# Patient Record
Sex: Male | Born: 1950 | Race: White | Hispanic: No | Marital: Married | Smoking: Never smoker
Health system: Southern US, Community
[De-identification: ages and names within clinical notes are randomized; demographics above are authoritative.]

## PROBLEM LIST (undated history)

## (undated) DIAGNOSIS — R42 Dizziness and giddiness: Secondary | ICD-10-CM

## (undated) DIAGNOSIS — G4730 Sleep apnea, unspecified: Secondary | ICD-10-CM

## (undated) DIAGNOSIS — E7800 Pure hypercholesterolemia, unspecified: Secondary | ICD-10-CM

## (undated) DIAGNOSIS — T4145XA Adverse effect of unspecified anesthetic, initial encounter: Secondary | ICD-10-CM

## (undated) DIAGNOSIS — R112 Nausea with vomiting, unspecified: Secondary | ICD-10-CM

## (undated) DIAGNOSIS — N400 Enlarged prostate without lower urinary tract symptoms: Secondary | ICD-10-CM

## (undated) DIAGNOSIS — Z9889 Other specified postprocedural states: Secondary | ICD-10-CM

## (undated) DIAGNOSIS — M1990 Unspecified osteoarthritis, unspecified site: Secondary | ICD-10-CM

## (undated) DIAGNOSIS — T8859XA Other complications of anesthesia, initial encounter: Secondary | ICD-10-CM

## (undated) HISTORY — PX: FRACTURE SURGERY: SHX138

## (undated) HISTORY — PX: KNEE SURGERY: SHX244

---

## 1978-04-18 HISTORY — PX: FRACTURE SURGERY: SHX138

## 2015-03-19 HISTORY — PX: HAND SURGERY: SHX662

## 2018-06-07 ENCOUNTER — Inpatient Hospital Stay: Admission: RE | Admit: 2018-06-07 | Payer: Self-pay | Source: Ambulatory Visit

## 2018-06-10 ENCOUNTER — Inpatient Hospital Stay: Admission: RE | Admit: 2018-06-10 | Payer: Self-pay | Source: Ambulatory Visit

## 2018-06-11 ENCOUNTER — Inpatient Hospital Stay: Admission: RE | Admit: 2018-06-11 | Payer: PRIVATE HEALTH INSURANCE | Source: Ambulatory Visit

## 2018-06-14 ENCOUNTER — Encounter
Admission: RE | Admit: 2018-06-14 | Discharge: 2018-06-14 | Disposition: A | Discharge status: Home / Self Care | Payer: Medicare Other | Source: Ambulatory Visit | Attending: Orthopedic Surgery | Admitting: Orthopedic Surgery

## 2018-06-14 ENCOUNTER — Encounter: Payer: Self-pay | Admitting: *Deleted

## 2018-06-14 ENCOUNTER — Other Ambulatory Visit: Payer: Self-pay

## 2018-06-14 HISTORY — DX: Other complications of anesthesia, initial encounter: T88.59XA

## 2018-06-14 HISTORY — DX: Nausea with vomiting, unspecified: Z98.890

## 2018-06-14 HISTORY — DX: Other specified postprocedural states: R11.2

## 2018-06-14 HISTORY — DX: Adverse effect of unspecified anesthetic, initial encounter: T41.45XA

## 2018-06-14 NOTE — Patient Instructions (Signed)
Your procedure is scheduled on:06/15/18 Report to Day Surgery. [facility] To find out your arrival time please call [phone] between 1PM - 3PM on 06/14/18  Remember: Instructions that are not followed completely may result in serious medical risk,  up to and including death, or upon the discretion of your surgeon and anesthesiologist your  surgery may need to be rescheduled.     _X__ 1. Do not eat food after midnight the night before your procedure.                 No gum chewing or hard candies. You may drink clear liquids up to 2 hours                 before you are scheduled to arrive for your surgery- DO not drink clear                 liquids within 2 hours of the start of your surgery.                 Clear Liquids include:  water, apple juice without pulp, clear carbohydrate                 drink such as Clearfast of Gatorade, Black Coffee or Tea (Do not add                 anything to coffee or tea).  __X__2.  On the morning of surgery brush your teeth with toothpaste and water, you                may rinse your mouth with mouthwash if you wish.  Do not swallow any toothpaste of mouthwash.     _X__ 3.  No Alcohol for 24 hours before or after surgery.   _X__ 4.  Do Not Smoke or use e-cigarettes For 24 Hours Prior to Your Surgery.                 Do not use any chewable tobacco products for at least 6 hours prior to                 surgery.  _X___  5.  Bring all medications with you on the day of surgery if instructed.   ____  6.  Notify your doctor if there is any change in your medical condition      (cold, fever, infections).     Do not wear jewelry, make-up, hairpins, clips or nail polish. Do not wear lotions, powders, or perfumes. You may wear deodorant. Do not shave 48 hours prior to surgery. Men may shave face and neck. Do not bring valuables to the hospital.    [facility] is not responsible for any belongings or  valuables.  Contacts, dentures or bridgework may not be worn into surgery. Leave your suitcase in the car. After surgery it may be brought to your room. For patients admitted to the hospital, discharge time is determined by your treatment team.   Patients discharged the day of surgery will not be allowed to drive home.      __X__ Take these medicines the morning of surgery with A SIP OF WATER:    1. FLOMAX  2.   3.   4.  5.  6.  ____ Fleet Enema (as directed)   ____ Use CHG Soap as directed  ____ Use inhalers on the day of surgery  ____ Stop metformin 2 days prior to surgery  ____ Take 1/2 of usual insulin dose the night before surgery. No insulin the morning          of surgery.   ____ Stop Coumadin/Plavix/aspirin on   ____ Stop Anti-inflammatories on     NO MORE MOBIC TODAY  ____ Stop supplements until after surgery.    ___X_ Bring C-Pap to the hospital.

## 2018-06-15 ENCOUNTER — Encounter
Admission: RE | Disposition: A | Discharge status: Home / Self Care | Payer: Self-pay | Source: Ambulatory Visit | Attending: Orthopedic Surgery

## 2018-06-15 ENCOUNTER — Ambulatory Visit: Payer: Medicare Other | Admitting: Anesthesiology

## 2018-06-15 ENCOUNTER — Ambulatory Visit
Admission: RE | Admit: 2018-06-15 | Discharge: 2018-06-15 | Disposition: A | Discharge status: Home / Self Care | Payer: Medicare Other | Source: Ambulatory Visit | Attending: Orthopedic Surgery | Admitting: Orthopedic Surgery

## 2018-06-15 ENCOUNTER — Other Ambulatory Visit: Payer: Self-pay

## 2018-06-15 ENCOUNTER — Encounter: Payer: Self-pay | Admitting: Emergency Medicine

## 2018-06-15 DIAGNOSIS — M65332 Trigger finger, left middle finger: Secondary | ICD-10-CM | POA: Diagnosis present

## 2018-06-15 DIAGNOSIS — N400 Enlarged prostate without lower urinary tract symptoms: Secondary | ICD-10-CM | POA: Insufficient documentation

## 2018-06-15 DIAGNOSIS — Z79899 Other long term (current) drug therapy: Secondary | ICD-10-CM | POA: Diagnosis not present

## 2018-06-15 DIAGNOSIS — E7800 Pure hypercholesterolemia, unspecified: Secondary | ICD-10-CM | POA: Diagnosis not present

## 2018-06-15 DIAGNOSIS — M25861 Other specified joint disorders, right knee: Secondary | ICD-10-CM | POA: Insufficient documentation

## 2018-06-15 DIAGNOSIS — E785 Hyperlipidemia, unspecified: Secondary | ICD-10-CM | POA: Diagnosis not present

## 2018-06-15 DIAGNOSIS — Z9989 Dependence on other enabling machines and devices: Secondary | ICD-10-CM | POA: Insufficient documentation

## 2018-06-15 DIAGNOSIS — G4733 Obstructive sleep apnea (adult) (pediatric): Secondary | ICD-10-CM | POA: Diagnosis not present

## 2018-06-15 DIAGNOSIS — G4730 Sleep apnea, unspecified: Secondary | ICD-10-CM | POA: Diagnosis not present

## 2018-06-15 HISTORY — DX: Sleep apnea, unspecified: G47.30

## 2018-06-15 HISTORY — DX: Unspecified osteoarthritis, unspecified site: M19.90

## 2018-06-15 HISTORY — DX: Pure hypercholesterolemia, unspecified: E78.00

## 2018-06-15 HISTORY — DX: Benign prostatic hyperplasia without lower urinary tract symptoms: N40.0

## 2018-06-15 HISTORY — PX: EAR CYST EXCISION: SHX22

## 2018-06-15 SURGERY — EXCISION, SYNOVIAL CYST, POPLITEAL SPACE
Anesthesia: General | Laterality: Right

## 2018-06-15 MED ORDER — PROPOFOL 10 MG/ML IV BOLUS
INTRAVENOUS | Status: DC | PRN
  Administered 2018-06-15: 50 mg via INTRAVENOUS
  Administered 2018-06-15: 150 mg via INTRAVENOUS

## 2018-06-15 MED ORDER — ONDANSETRON HCL 4 MG PO TABS: 4.0000 mg | ORAL_TABLET | Freq: Four times a day (QID) | ORAL | Status: DC | PRN

## 2018-06-15 MED ORDER — ONDANSETRON HCL 4 MG/2ML IJ SOLN
INTRAMUSCULAR | Status: DC | PRN
  Administered 2018-06-15: 4 mg via INTRAVENOUS

## 2018-06-15 MED ORDER — DEXAMETHASONE SODIUM PHOSPHATE 4 MG/ML IJ SOLN
INTRAMUSCULAR | Status: DC | PRN
  Administered 2018-06-15: 5 mg via INTRAVENOUS

## 2018-06-15 MED ORDER — MIDAZOLAM HCL 2 MG/2ML IJ SOLN
INTRAMUSCULAR | Status: AC
  Filled 2018-06-15: qty 2

## 2018-06-15 MED ORDER — LIDOCAINE HCL (CARDIAC) PF 100 MG/5ML IV SOSY
PREFILLED_SYRINGE | INTRAVENOUS | Status: DC | PRN
  Administered 2018-06-15: 100 mg via INTRAVENOUS

## 2018-06-15 MED ORDER — LIDOCAINE HCL (PF) 2 % IJ SOLN
INTRAMUSCULAR | Status: AC
  Filled 2018-06-15: qty 10

## 2018-06-15 MED ORDER — FAMOTIDINE 20 MG PO TABS
20.0000 mg | ORAL_TABLET | Freq: Once | ORAL | Status: AC
  Administered 2018-06-15: 20 mg via ORAL

## 2018-06-15 MED ORDER — SODIUM CHLORIDE 0.9 % IV SOLN: INTRAVENOUS | Status: DC

## 2018-06-15 MED ORDER — HYDROCODONE-ACETAMINOPHEN 5-325 MG PO TABS: 1.0000 | ORAL_TABLET | ORAL | Status: DC | PRN

## 2018-06-15 MED ORDER — KETAMINE HCL 50 MG/ML IJ SOLN
INTRAMUSCULAR | Status: DC | PRN
  Administered 2018-06-15: 25 mg via INTRAVENOUS

## 2018-06-15 MED ORDER — MIDAZOLAM HCL 2 MG/2ML IJ SOLN
INTRAMUSCULAR | Status: DC | PRN
  Administered 2018-06-15: 1 mg via INTRAVENOUS

## 2018-06-15 MED ORDER — DEXAMETHASONE SODIUM PHOSPHATE 10 MG/ML IJ SOLN
INTRAMUSCULAR | Status: AC
  Filled 2018-06-15: qty 1

## 2018-06-15 MED ORDER — ONDANSETRON HCL 4 MG/2ML IJ SOLN: 4.0000 mg | Freq: Four times a day (QID) | INTRAMUSCULAR | Status: DC | PRN

## 2018-06-15 MED ORDER — METOCLOPRAMIDE HCL 5 MG/ML IJ SOLN: 5.0000 mg | Freq: Three times a day (TID) | INTRAMUSCULAR | Status: DC | PRN

## 2018-06-15 MED ORDER — KETAMINE HCL 50 MG/ML IJ SOLN
INTRAMUSCULAR | Status: AC
  Filled 2018-06-15: qty 10

## 2018-06-15 MED ORDER — FENTANYL CITRATE (PF) 250 MCG/5ML IJ SOLN
INTRAMUSCULAR | Status: AC
  Filled 2018-06-15: qty 5

## 2018-06-15 MED ORDER — BUPIVACAINE HCL (PF) 0.5 % IJ SOLN
INTRAMUSCULAR | Status: DC | PRN
  Administered 2018-06-15: 15 mL

## 2018-06-15 MED ORDER — FENTANYL CITRATE (PF) 100 MCG/2ML IJ SOLN
INTRAMUSCULAR | Status: DC | PRN
  Administered 2018-06-15: 50 ug via INTRAVENOUS

## 2018-06-15 MED ORDER — HYDROCODONE-ACETAMINOPHEN 5-325 MG PO TABS: 1.0000 | ORAL_TABLET | Freq: Four times a day (QID) | ORAL | 0 refills | Status: DC | PRN

## 2018-06-15 MED ORDER — FENTANYL CITRATE (PF) 100 MCG/2ML IJ SOLN: 25.0000 ug | INTRAMUSCULAR | Status: DC | PRN

## 2018-06-15 MED ORDER — LACTATED RINGERS IV SOLN
INTRAVENOUS | Status: DC
  Administered 2018-06-15: 11:00:00 via INTRAVENOUS

## 2018-06-15 MED ORDER — PROPOFOL 500 MG/50ML IV EMUL
INTRAVENOUS | Status: AC
  Filled 2018-06-15: qty 50

## 2018-06-15 MED ORDER — GLYCOPYRROLATE 0.2 MG/ML IJ SOLN
INTRAMUSCULAR | Status: AC
  Filled 2018-06-15: qty 1

## 2018-06-15 MED ORDER — FAMOTIDINE 20 MG PO TABS
ORAL_TABLET | ORAL | Status: AC
  Filled 2018-06-15: qty 1

## 2018-06-15 MED ORDER — ONDANSETRON HCL 4 MG/2ML IJ SOLN: 4.0000 mg | Freq: Once | INTRAMUSCULAR | Status: DC | PRN

## 2018-06-15 MED ORDER — SODIUM CHLORIDE 0.9 % IV SOLN
INTRAVENOUS | Status: DC | PRN
  Administered 2018-06-15: 15 ug/kg/min via INTRAVENOUS
  Administered 2018-06-15: 10 ug/kg/min via INTRAVENOUS

## 2018-06-15 MED ORDER — NEOMYCIN-POLYMYXIN B GU 40-200000 IR SOLN
Status: DC | PRN
  Administered 2018-06-15: 2 mL

## 2018-06-15 MED ORDER — ONDANSETRON HCL 4 MG/2ML IJ SOLN
INTRAMUSCULAR | Status: AC
  Filled 2018-06-15: qty 2

## 2018-06-15 MED ORDER — METOCLOPRAMIDE HCL 10 MG PO TABS: 5.0000 mg | ORAL_TABLET | Freq: Three times a day (TID) | ORAL | Status: DC | PRN

## 2018-06-15 MED ORDER — PROPOFOL 500 MG/50ML IV EMUL
INTRAVENOUS | Status: DC | PRN
  Administered 2018-06-15: 100 ug/kg/min via INTRAVENOUS
  Administered 2018-06-15: 150 ug/kg/min via INTRAVENOUS

## 2018-06-15 SURGICAL SUPPLY — 39 items
BANDAGE ELASTIC 6 LF NS (GAUZE/BANDAGES/DRESSINGS) ×3 IMPLANT
BNDG COHESIVE 6X5 TAN STRL LF (GAUZE/BANDAGES/DRESSINGS) ×2 IMPLANT
CANISTER SUCT 1200ML W/VALVE (MISCELLANEOUS) ×3 IMPLANT
CAST PADDING 2X4YD ST 30245 (MISCELLANEOUS) ×2
CHLORAPREP W/TINT 26ML (MISCELLANEOUS) ×5 IMPLANT
COVER WAND RF STERILE (DRAPES) ×3 IMPLANT
CUFF TOURN 18 STER (MISCELLANEOUS) ×2 IMPLANT
CUFF TOURN 24 STER (MISCELLANEOUS) IMPLANT
CUFF TOURN 30 STER DUAL PORT (MISCELLANEOUS) ×2 IMPLANT
DRAPE SHEET LG 3/4 BI-LAMINATE (DRAPES) ×2 IMPLANT
ELECT CAUTERY BLADE 6.4 (BLADE) ×3 IMPLANT
ELECT REM PT RETURN 9FT ADLT (ELECTROSURGICAL) ×3
ELECTRODE REM PT RTRN 9FT ADLT (ELECTROSURGICAL) ×1 IMPLANT
GAUZE PETRO XEROFOAM 1X8 (MISCELLANEOUS) ×3 IMPLANT
GAUZE SPONGE 4X4 12PLY STRL (GAUZE/BANDAGES/DRESSINGS) ×3 IMPLANT
GLOVE SURG SYN 9.0  PF PI (GLOVE) ×2
GLOVE SURG SYN 9.0 PF PI (GLOVE) ×1 IMPLANT
GOWN SRG 2XL LVL 4 RGLN SLV (GOWNS) ×1 IMPLANT
GOWN STRL NON-REIN 2XL LVL4 (GOWNS) ×2
GOWN STRL REUS W/ TWL LRG LVL3 (GOWN DISPOSABLE) ×1 IMPLANT
GOWN STRL REUS W/TWL LRG LVL3 (GOWN DISPOSABLE) ×2
KIT TURNOVER KIT A (KITS) ×3 IMPLANT
NS IRRIG 500ML POUR BTL (IV SOLUTION) ×3 IMPLANT
PACK EXTREMITY ARMC (MISCELLANEOUS) ×3 IMPLANT
PAD ABD DERMACEA PRESS 5X9 (GAUZE/BANDAGES/DRESSINGS) ×3 IMPLANT
PAD CAST CTTN 4X4 STRL (SOFTGOODS) IMPLANT
PADDING CAST COTTON 2X4 ST (MISCELLANEOUS) IMPLANT
PADDING CAST COTTON 4X4 STRL (SOFTGOODS) ×2
SCALPEL PROTECTED #10 DISP (BLADE) ×3 IMPLANT
STAPLER SKIN PROX 35W (STAPLE) ×3 IMPLANT
STOCKINETTE IMPERV 14X48 (MISCELLANEOUS) ×2 IMPLANT
SUT ETHILON 3-0 FS-10 30 BLK (SUTURE) ×3
SUT VIC AB 0 CT1 27 (SUTURE) ×2
SUT VIC AB 0 CT1 27XCR 8 STRN (SUTURE) ×1 IMPLANT
SUT VIC AB 0 CT1 36 (SUTURE) ×3 IMPLANT
SUT VIC AB 2-0 CT1 27 (SUTURE) ×2
SUT VIC AB 2-0 CT1 TAPERPNT 27 (SUTURE) ×1 IMPLANT
SUTURE EHLN 3-0 FS-10 30 BLK (SUTURE) IMPLANT
SYR 10ML LL (SYRINGE) ×3 IMPLANT

## 2018-06-15 NOTE — Anesthesia Post-op Follow-up Note (Signed)
Anesthesia QCDR form completed.        

## 2018-06-15 NOTE — Op Note (Signed)
06/15/2018  1:12 PM  PATIENT:  Mark Davenport  67 y.o. male  PRE-OPERATIVE DIAGNOSIS:  CYST OF JOINT RIGHT KNEE, TRIGGER FINGER LEFT  POST-OPERATIVE DIAGNOSIS:  CYST OF JOINT RIGHT KNEE, TRIGGER FINGER LEFT  PROCEDURE:  Procedure(s): EXCISION  KNEE CYST (Right) left trigger finger release long finger  SURGEON: Leitha Schuller, MD  ASSISTANTS: None  ANESTHESIA:   general  EBL:  Total I/O In: 700 [I.V.:700] Out: 1 [Blood:1]  BLOOD ADMINISTERED:none  DRAINS: none   LOCAL MEDICATIONS USED:  MARCAINE     SPECIMEN:  No Specimen  DISPOSITION OF SPECIMEN:  N/A  COUNTS:  YES  TOURNIQUET:  * Missing tourniquet times found for documented tourniquets in log: [id number] *  IMPLANTS: None  DICTATION: .Dragon Dictation patient was brought to the operating room and after adequate general anesthesia was obtained the left arm was prepped and draped you sterile fashion.  After patient identification and timeout procedures were completed tourniquet was raised.  Transverse incision approximately centimeter half was made at the distal flexion crease of the palm in line with the finger the subcutaneous tissue was spread and retractors placed to protect the neurovascular bundles there is a very thickened A1 pulley which was visible on immediately and on release there is a large amount of flexor tenosynovitis and fluid present deep and proximal to this after release of the entire A1 pulley there is no triggering the tendon itself was intact.  The wound was then closed with simple interrupted 4-0 nylon suture followed by infiltration of 5 cc half percent Sensorcaine for postop analgesia with a finger then wrapped with Xeroform 4 x 4's web roll and Ace wrap tourniquet let down with 5-minute tourniquet on the hand Next the right leg was prepped and draped in the sterile fashion and a longitudinal incision made at the distal patellar tendon and its near its insertion incision through the skin revealed a ganglion  cyst which was then excised there did appear to be a small defect in the patellar tendon this was incised and debrided and then repaired with 0 Vicryl and the subcutaneous layer was closed with 2-0 Vicryl and skin closed with 4-0 nylon in a simple interrupted manner with assist being completely excised.  10 cc of half percent Sensorcaine plain was infiltrated in the area around the incision for postop analgesia Xeroform 4 x 4's web roll and Ace wrap applied x10 minutes on the knee  PLAN OF CARE: Discharge to home after PACU  PATIENT DISPOSITION:  PACU - hemodynamically stable.

## 2018-06-15 NOTE — Transfer of Care (Signed)
Immediate Anesthesia Transfer of Care Note  Patient: Mark Davenport  Procedure(s) Performed: EXCISION  KNEE CYST (Right )  Patient Location: PACU  Anesthesia Type:General  Level of Consciousness: awake and patient cooperative  Airway & Oxygen Therapy: Patient Spontanous Breathing and Patient connected to nasal cannula oxygen  Post-op Assessment: Report given to RN and Post -op Vital signs reviewed and stable  Post vital signs: Reviewed and stable  Last Vitals:  Vitals Value Taken Time  BP    Temp    Pulse 83 06/15/2018  1:21 PM  Resp    SpO2 100 % 06/15/2018  1:21 PM  Vitals shown include unvalidated device data.  Last Pain:  Vitals:   06/15/18 1100  TempSrc: Oral  PainSc: 0-No pain         Complications: No apparent anesthesia complications

## 2018-06-15 NOTE — Discharge Instructions (Addendum)
Work on finger range of motion with left hand and loosen Ace wrap prior to discharge and if fingers well.  Leave knee wrap on until return visit with weightbearing as tolerated on right leg.  Medicine as directed  AMBULATORY SURGERY  DISCHARGE INSTRUCTIONS   1) The drugs that you were given will stay in your system until tomorrow so for the next 24 hours you should not:  A) Drive an automobile B) Make any legal decisions C) Drink any alcoholic beverage   2) You may resume regular meals tomorrow.  Today it is better to start with liquids and gradually work up to solid foods.  You may eat anything you prefer, but it is better to start with liquids, then soup and crackers, and gradually work up to solid foods.   3) Please notify your doctor immediately if you have any unusual bleeding, trouble breathing, redness and pain at the surgery site, drainage, fever, or pain not relieved by medication.    4) Additional Instructions:        Please contact your physician with any problems or Same Day Surgery at [phone], Monday through Friday 6 am to 4 pm, or [place] at [facility] number at [phone].

## 2018-06-15 NOTE — Anesthesia Procedure Notes (Signed)
Procedure Name: LMA Insertion Date/Time: 06/15/2018 12:35 PM Performed by: Darrol Jump, CRNA Pre-anesthesia Checklist: Patient identified, Emergency Drugs available, Suction available and Patient being monitored Patient Re-evaluated:Patient Re-evaluated prior to induction Oxygen Delivery Method: Circle system utilized Preoxygenation: Pre-oxygenation with 100% oxygen Induction Type: IV induction LMA Size: 5.0 Number of attempts: 1 Placement Confirmation: positive ETCO2 and breath sounds checked- equal and bilateral Tube secured with: Tape Dental Injury: Teeth and Oropharynx as per pre-operative assessment

## 2018-06-15 NOTE — Anesthesia Postprocedure Evaluation (Signed)
Anesthesia Post Note  Patient: Mark Davenport  Procedure(s) Performed: EXCISION  KNEE CYST (Right )  Patient location during evaluation: PACU Anesthesia Type: General Level of consciousness: awake and alert Pain management: pain level controlled Vital Signs Assessment: post-procedure vital signs reviewed and stable Respiratory status: spontaneous breathing, nonlabored ventilation, respiratory function stable and patient connected to nasal cannula oxygen Cardiovascular status: blood pressure returned to baseline and stable Postop Assessment: no apparent nausea or vomiting Anesthetic complications: no     Last Vitals:  Vitals:   06/15/18 1415 06/15/18 1430  BP: (!) 140/99 (!) 146/95  Pulse: (!) 57   Resp:    Temp: (!) 36.1 C   SpO2:  100%    Last Pain:  Vitals:   06/15/18 1430  TempSrc:   PainSc: 0-No pain                 Lenard Simmer

## 2018-06-15 NOTE — H&P (Signed)
Reviewed paper H+P, will be scanned into chart. No changes noted.  

## 2018-06-15 NOTE — OR Nursing (Signed)
Discharge instructions discussed with pt and wife. Both voice understanding. 

## 2018-06-15 NOTE — Anesthesia Preprocedure Evaluation (Signed)
Anesthesia Evaluation  Patient identified by MRN, date of birth, ID band Patient awake    Reviewed: Allergy & Precautions, H&P , NPO status , Patient's Chart, lab work & pertinent test results, reviewed documented beta blocker date and time   History of Anesthesia Complications (+) PONV and history of anesthetic complications  Airway Mallampati: III  TM Distance: >3 FB Neck ROM: full    Dental  (+) Dental Advidsory Given, Caps, Missing, Teeth Intact   Pulmonary neg shortness of breath, sleep apnea and Continuous Positive Airway Pressure Ventilation , neg COPD, neg recent URI,           Cardiovascular Exercise Tolerance: Good negative cardio ROS       Neuro/Psych negative neurological ROS  negative psych ROS   GI/Hepatic negative GI ROS, Neg liver ROS,   Endo/Other  negative endocrine ROS  Renal/GU negative Renal ROS  negative genitourinary   Musculoskeletal   Abdominal   Peds  Hematology negative hematology ROS (+)   Anesthesia Other Findings Past Medical History: No date: Arthritis No date: BPH (benign prostatic hyperplasia) No date: Complication of anesthesia No date: Hypercholesteremia No date: PONV (postoperative nausea and vomiting) No date: Sleep apnea     Comment:  OSA/CPAP   Reproductive/Obstetrics negative OB ROS                             Anesthesia Physical Anesthesia Plan  ASA: II  Anesthesia Plan: General   Post-op Pain Management:    Induction: Intravenous  PONV Risk Score and Plan: 3 and Propofol infusion, TIVA, Ondansetron, Dexamethasone and Treatment may vary due to age or medical condition  Airway Management Planned: LMA  Additional Equipment:   Intra-op Plan:   Post-operative Plan: Extubation in OR  Informed Consent: I have reviewed the patients History and Physical, chart, labs and discussed the procedure including the risks, benefits and  alternatives for the proposed anesthesia with the patient or authorized representative who has indicated his/her understanding and acceptance.   Dental Advisory Given  Plan Discussed with: Anesthesiologist, CRNA and Surgeon  Anesthesia Plan Comments:         Anesthesia Quick Evaluation

## 2018-06-16 ENCOUNTER — Encounter: Payer: Self-pay | Admitting: Orthopedic Surgery

## 2019-08-22 ENCOUNTER — Ambulatory Visit: Payer: PRIVATE HEALTH INSURANCE | Attending: Internal Medicine

## 2019-08-22 DIAGNOSIS — Z20822 Contact with and (suspected) exposure to covid-19: Secondary | ICD-10-CM

## 2019-08-22 DIAGNOSIS — U071 COVID-19: Secondary | ICD-10-CM

## 2019-08-22 HISTORY — DX: COVID-19: U07.1

## 2019-08-24 LAB — NOVEL CORONAVIRUS, NAA: SARS-CoV-2, NAA: DETECTED — AB

## 2019-08-25 ENCOUNTER — Telehealth: Payer: Self-pay | Admitting: Nurse Practitioner

## 2019-08-25 NOTE — Telephone Encounter (Signed)
Called to Discuss with patient about Covid symptoms and the use of bamlanivimab, a monoclonal antibody infusion for those with mild to moderate Covid symptoms and at a high risk of hospitalization.     Pt is qualified for this infusion at the [facility] due to co-morbid conditions and/or a member of an at-risk group.     Unable to reach pt  

## 2020-10-18 ENCOUNTER — Other Ambulatory Visit: Payer: Self-pay | Admitting: Orthopedic Surgery

## 2020-10-18 DIAGNOSIS — M25511 Pain in right shoulder: Secondary | ICD-10-CM

## 2020-10-23 ENCOUNTER — Ambulatory Visit
Admission: RE | Admit: 2020-10-23 | Discharge: 2020-10-23 | Disposition: A | Discharge status: Home / Self Care | Payer: Medicare HMO | Source: Ambulatory Visit | Attending: Orthopedic Surgery | Admitting: Orthopedic Surgery

## 2020-10-23 ENCOUNTER — Other Ambulatory Visit: Payer: Self-pay

## 2020-10-23 DIAGNOSIS — M25511 Pain in right shoulder: Secondary | ICD-10-CM | POA: Insufficient documentation

## 2020-10-23 IMAGING — MR MR SHOULDER*R* W/O CM
4 of 5 series · 32 of 40 positions shown · non-contrast
Comparison: None.

CLINICAL DATA: anterior right shoulder pain

EXAM:
MRI OF THE RIGHT SHOULDER WITHOUT CONTRAST
TECHNIQUE: Multiplanar, multisequence MR imaging of the shoulder was performed.
No intravenous contrast was administered.

[Series 5: T2 fat-sat · axial · right · 4.0mm · 0.55mm/px · z∈[-43,+77]mm · 8 of 26 slices shown (1 of 3)]
[im 1/26]
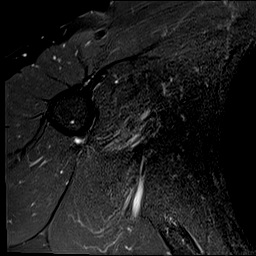
[im 4/26]
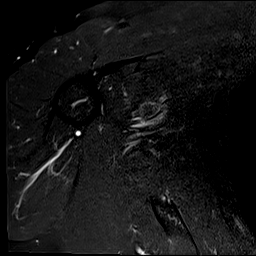
[im 8/26]
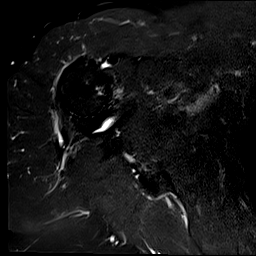
[im 11/26]
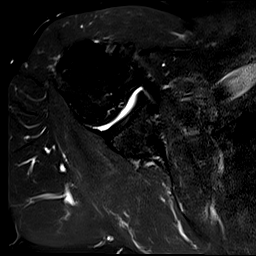
[im 15/26]
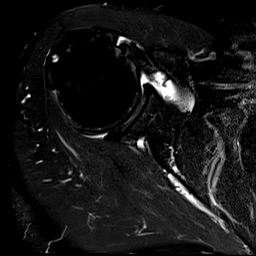
[im 18/26]
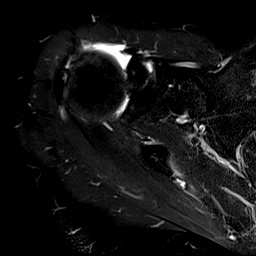
[im 22/26]
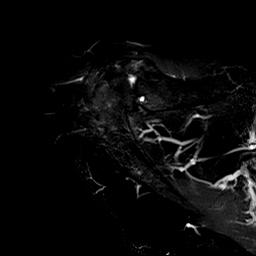
[im 26/26]
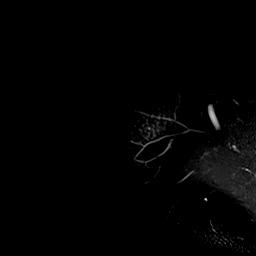

[Series 6: PD · oblique · right · 4.0mm · 0.44mm/px · 9 of 26 slices shown]
[im 1/26]
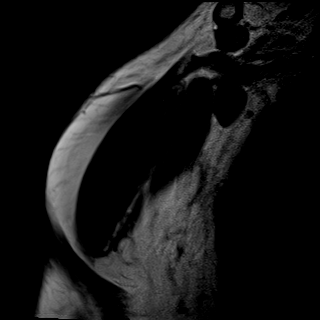
[im 4/26]
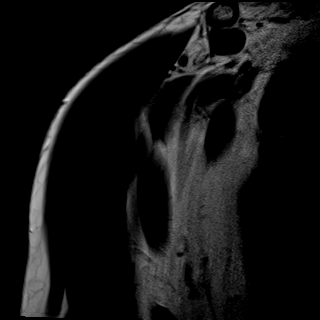
[im 7/26]
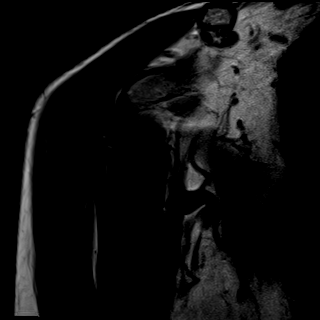
[im 10/26]
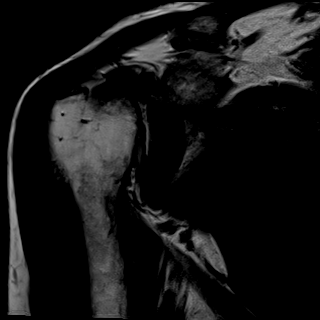
[im 13/26]
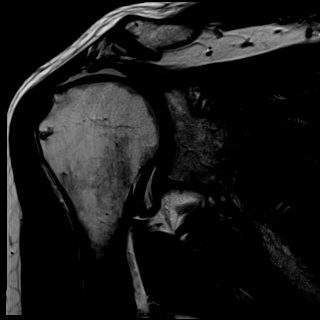
[im 16/26]
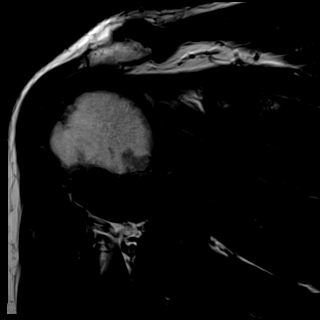
[im 19/26]
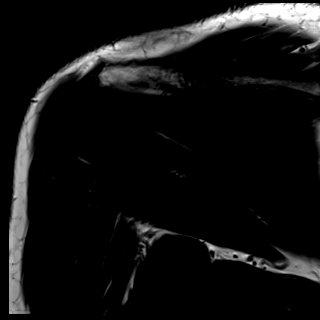
[im 22/26]
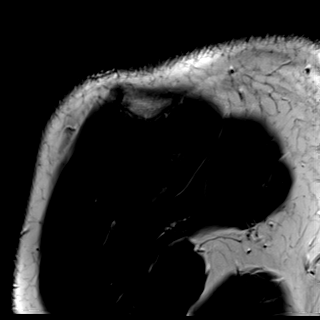
[im 26/26]
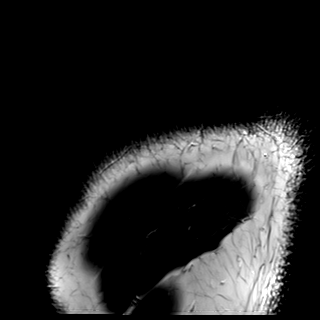

[Series 7: T2 fat-sat · oblique · right · 4.0mm · 0.44mm/px · 9 of 26 slices shown (2 of 3)]
[im 1/26]
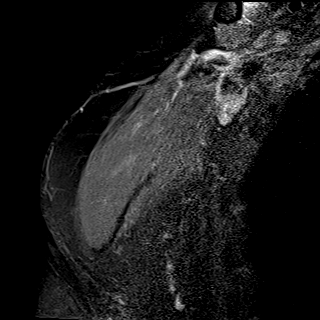
[im 4/26]
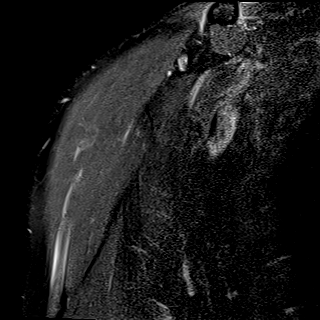
[im 7/26]
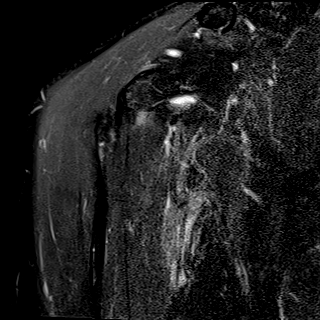
[im 10/26]
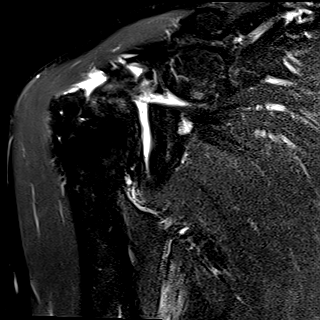
[im 13/26]
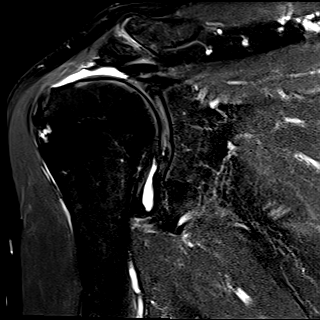
[im 16/26]
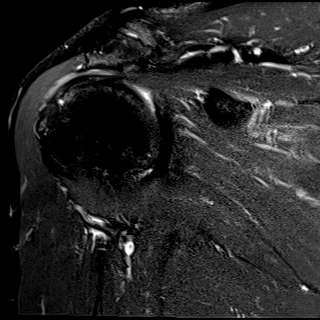
[im 19/26]
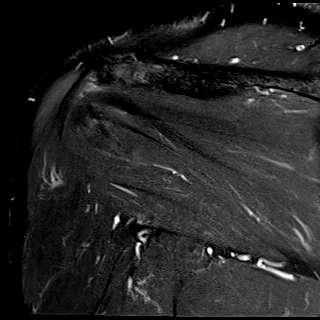
[im 22/26]
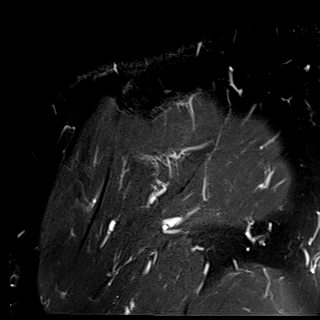
[im 26/26]
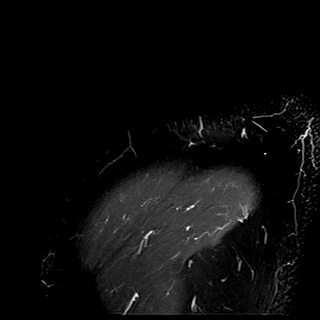

[Series 8: T2 fat-sat · oblique · right · 4.0mm · 0.23mm/px · 6 of 22 slices shown (3 of 3)]
[im 1/22]
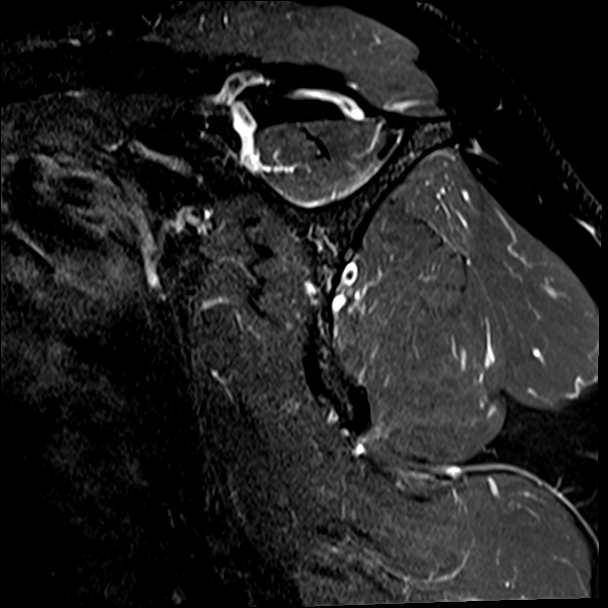
[im 4/22]
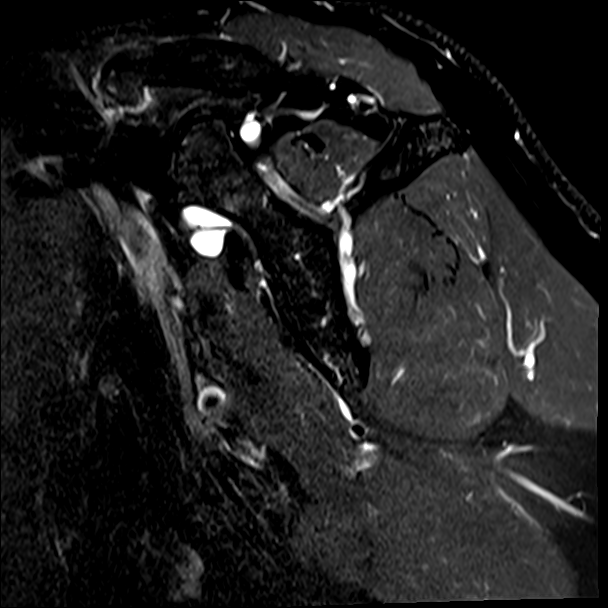
[im 8/22]
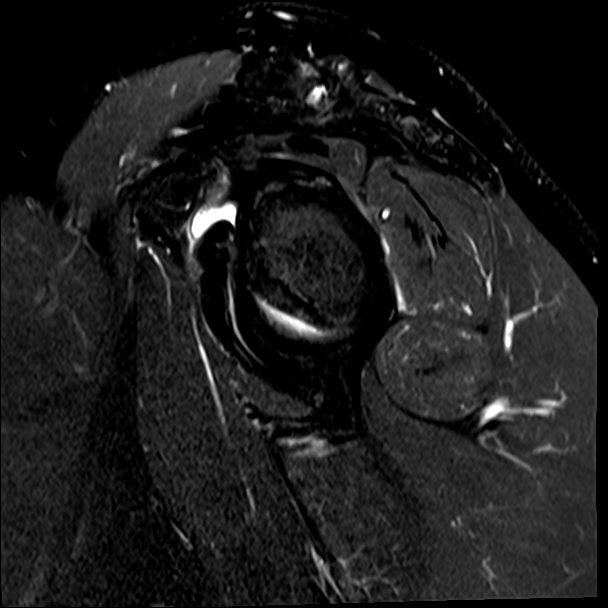
[im 11/22]
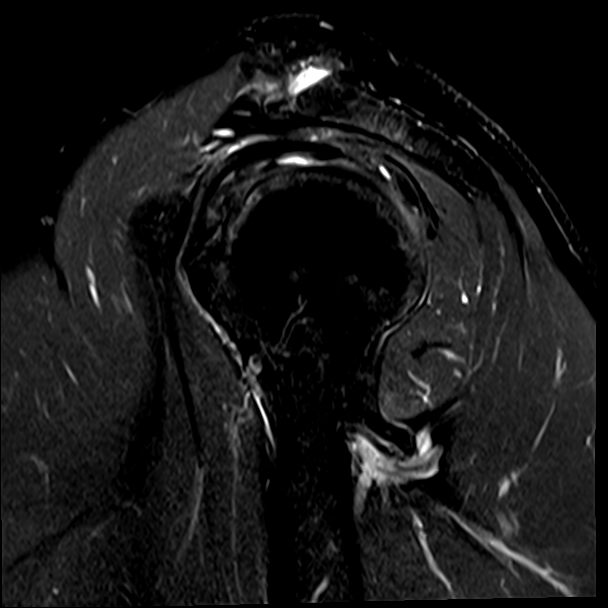
[im 15/22]
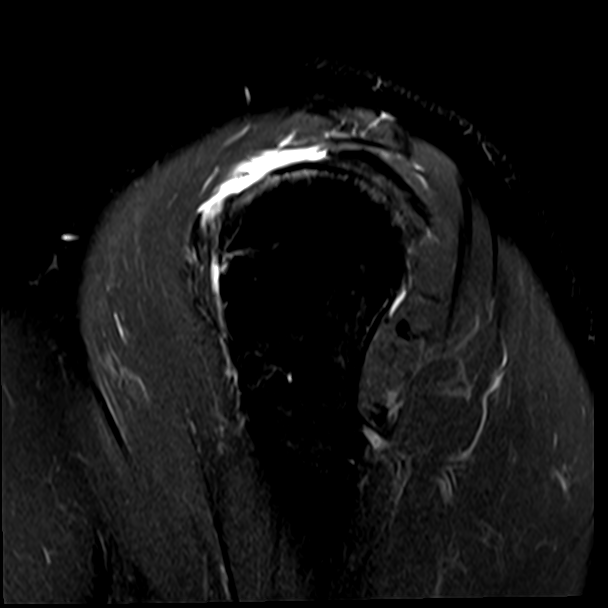
[im 18/22]
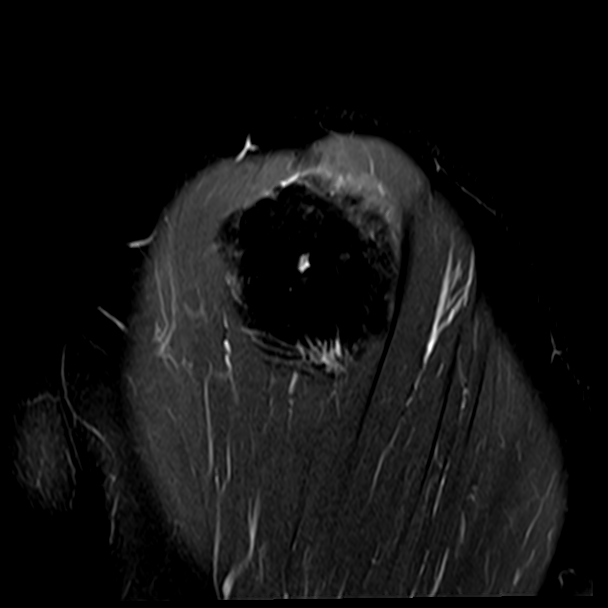

[32 of 40 positions shown; findings below may reference images not displayed]

FINDINGS: Rotator cuff: There is a complete full-thickness tear of the
supraspinatus tendon measuring 3 cm and transverse dimension and
extending approximately 1.3 cm from the tendon insertion site. There
is resultant slightly high-riding humeral head and fluid in the
subacromial-subdeltoid bursa. Increased globular signal thickening
is seen throughout the subscapularis tendon. There is a tiny focal
full-thickness tear of the superior subscapularis tendon with
approximately 1.3 cm of tendon retraction. Increased globular signal
seen within the infraspinatus tendon. The teres minor tendon is
intact. The muscles of the rotator cuff are normal without tear,
edema, or atrophy.

Muscles: The muscles other than the rotator cuff are normal without
tear, edema, or atrophy.

Biceps Long Head: Increased globular signal seen within the
intra-articular portion of the long head of the biceps tendon,
however it is intact.

Acromioclavicular Joint: Moderate AC joint arthrosis seen with joint
space loss capsular hypertrophy and joint fluid. Type II acromion.

Glenohumeral Joint: A slightly high-riding humeral head is seen.
Mild glenohumeral joint chondral irregularity is noted within the
inferior joint space. A small glenohumeral joint effusion is seen.

Labrum: Attenuation of the superior labrum is seen. However the
evaluation is limited by lack of intraarticular fluid.

Bones: No fracture, osteonecrosis, or pathologic marrow
infiltration. Cystic changes are seen at the lateral corner of the
humeral head.

Other: Fluid seen within the subacromial-subdeltoid bursa.
IMPRESSION: 1. Complete full-thickness tear of the supraspinatus tendon with
tendon retraction measuring approximately 3 cm and extending
approximately 1.3 cm from the tendon insertion site. There is
resultant high-riding humeral head and fluid in the
subacromial-subdeltoid bursa.
2. A small focal full-thickness tear of the superior subscapularis
tendon
3. Infraspinatus tendinosis
4. Mild glenohumeral joint osteoarthritis with superior labral
degeneration
5. Small glenohumeral joint effusion
6. Moderate AC joint arthrosis

## 2020-10-29 ENCOUNTER — Other Ambulatory Visit: Payer: Self-pay | Admitting: Orthopedic Surgery

## 2020-11-07 ENCOUNTER — Encounter: Payer: Self-pay | Admitting: Orthopedic Surgery

## 2020-11-07 ENCOUNTER — Other Ambulatory Visit: Payer: Self-pay

## 2020-11-14 ENCOUNTER — Other Ambulatory Visit: Payer: Self-pay

## 2020-11-14 ENCOUNTER — Other Ambulatory Visit
Admission: RE | Admit: 2020-11-14 | Discharge: 2020-11-14 | Disposition: A | Discharge status: Home / Self Care | Payer: Medicare HMO | Source: Ambulatory Visit | Attending: Orthopedic Surgery | Admitting: Orthopedic Surgery

## 2020-11-14 DIAGNOSIS — Z01812 Encounter for preprocedural laboratory examination: Secondary | ICD-10-CM | POA: Insufficient documentation

## 2020-11-14 DIAGNOSIS — Z20822 Contact with and (suspected) exposure to covid-19: Secondary | ICD-10-CM | POA: Insufficient documentation

## 2020-11-14 LAB — SARS CORONAVIRUS 2 (TAT 6-24 HRS): SARS Coronavirus 2: NEGATIVE

## 2020-11-16 ENCOUNTER — Ambulatory Visit: Payer: Medicare HMO | Admitting: Anesthesiology

## 2020-11-16 ENCOUNTER — Encounter
Admission: RE | Disposition: A | Discharge status: Home / Self Care | Payer: Self-pay | Source: Home / Self Care | Attending: Orthopedic Surgery

## 2020-11-16 ENCOUNTER — Other Ambulatory Visit: Payer: Self-pay

## 2020-11-16 ENCOUNTER — Ambulatory Visit
Admission: RE | Admit: 2020-11-16 | Discharge: 2020-11-16 | Disposition: A | Discharge status: Home / Self Care | Payer: Medicare HMO | Attending: Orthopedic Surgery | Admitting: Orthopedic Surgery

## 2020-11-16 ENCOUNTER — Encounter: Payer: Self-pay | Admitting: Orthopedic Surgery

## 2020-11-16 DIAGNOSIS — M25811 Other specified joint disorders, right shoulder: Secondary | ICD-10-CM | POA: Insufficient documentation

## 2020-11-16 DIAGNOSIS — M75121 Complete rotator cuff tear or rupture of right shoulder, not specified as traumatic: Secondary | ICD-10-CM | POA: Diagnosis not present

## 2020-11-16 DIAGNOSIS — M75101 Unspecified rotator cuff tear or rupture of right shoulder, not specified as traumatic: Secondary | ICD-10-CM | POA: Diagnosis present

## 2020-11-16 HISTORY — DX: Dizziness and giddiness: R42

## 2020-11-16 HISTORY — PX: SHOULDER ARTHROSCOPY WITH SUBACROMIAL DECOMPRESSION AND OPEN ROTATOR C: SHX5688

## 2020-11-16 SURGERY — SHOULDER ARTHROSCOPY WITH SUBACROMIAL DECOMPRESSION AND OPEN ROTATOR CUFF REPAIR, OPEN BICEPS TENDON REPAIR
Anesthesia: General | Site: Shoulder | Laterality: Right

## 2020-11-16 MED ORDER — OXYCODONE HCL 5 MG PO TABS
5.0000 mg | ORAL_TABLET | Freq: Once | ORAL | Status: DC | PRN
Start: 2020-11-16 — End: 2020-11-16

## 2020-11-16 MED ORDER — CEFAZOLIN SODIUM-DEXTROSE 2-4 GM/100ML-% IV SOLN
2.0000 g | INTRAVENOUS | Status: AC
  Administered 2020-11-16: 2 g via INTRAVENOUS

## 2020-11-16 MED ORDER — BUPIVACAINE HCL (PF) 0.5 % IJ SOLN
INTRAMUSCULAR | Status: DC | PRN
  Administered 2020-11-16: 100 mg

## 2020-11-16 MED ORDER — FENTANYL CITRATE (PF) 100 MCG/2ML IJ SOLN: 25.0000 ug | INTRAMUSCULAR | Status: DC | PRN

## 2020-11-16 MED ORDER — OXYCODONE HCL 5 MG PO TABS: 5.0000 mg | ORAL_TABLET | ORAL | 0 refills | Status: DC | PRN

## 2020-11-16 MED ORDER — PHENYLEPHRINE HCL (PRESSORS) 10 MG/ML IV SOLN
INTRAVENOUS | Status: DC | PRN
  Administered 2020-11-16: 100 ug via INTRAVENOUS

## 2020-11-16 MED ORDER — LIDOCAINE HCL (CARDIAC) PF 100 MG/5ML IV SOSY: PREFILLED_SYRINGE | INTRAVENOUS | Status: DC | PRN

## 2020-11-16 MED ORDER — MIDAZOLAM HCL 2 MG/2ML IJ SOLN
INTRAMUSCULAR | Status: DC | PRN
  Administered 2020-11-16: 2 mg via INTRAVENOUS

## 2020-11-16 MED ORDER — ACETAMINOPHEN 500 MG PO TABS: 1000.0000 mg | ORAL_TABLET | Freq: Three times a day (TID) | ORAL | 2 refills | Status: DC

## 2020-11-16 MED ORDER — FENTANYL CITRATE (PF) 100 MCG/2ML IJ SOLN
INTRAMUSCULAR | Status: DC | PRN
  Administered 2020-11-16: 100 ug via INTRAVENOUS

## 2020-11-16 MED ORDER — LACTATED RINGERS IV SOLN
INTRAVENOUS | Status: DC | PRN
  Administered 2020-11-16: 4 mL

## 2020-11-16 MED ORDER — MEPERIDINE HCL 25 MG/ML IJ SOLN: 6.2500 mg | INTRAMUSCULAR | Status: DC | PRN

## 2020-11-16 MED ORDER — PROPOFOL 500 MG/50ML IV EMUL
INTRAVENOUS | Status: DC | PRN
  Administered 2020-11-16: 160 ug/kg/min via INTRAVENOUS

## 2020-11-16 MED ORDER — LACTATED RINGERS IV SOLN: INTRAVENOUS | Status: DC

## 2020-11-16 MED ORDER — ONDANSETRON 4 MG PO TBDP: 4.0000 mg | ORAL_TABLET | Freq: Three times a day (TID) | ORAL | 0 refills | Status: DC | PRN

## 2020-11-16 MED ORDER — OXYCODONE HCL 5 MG/5ML PO SOLN: 5.0000 mg | Freq: Once | ORAL | Status: DC | PRN

## 2020-11-16 MED ORDER — BUPIVACAINE LIPOSOME 1.3 % IJ SUSP
INTRAMUSCULAR | Status: DC | PRN
  Administered 2020-11-16: 266 mg

## 2020-11-16 MED ORDER — PROMETHAZINE HCL 25 MG/ML IJ SOLN: 6.2500 mg | INTRAMUSCULAR | Status: DC | PRN

## 2020-11-16 MED ORDER — ONDANSETRON HCL 4 MG/2ML IJ SOLN
INTRAMUSCULAR | Status: DC | PRN
  Administered 2020-11-16: 4 mg via INTRAVENOUS

## 2020-11-16 MED ORDER — ASPIRIN EC 325 MG PO TBEC: 325.0000 mg | DELAYED_RELEASE_TABLET | Freq: Every day | ORAL | 0 refills | Status: AC

## 2020-11-16 SURGICAL SUPPLY — 67 items
ADAPTER IRRIG TUBE 2 SPIKE SOL (ADAPTER) ×4 IMPLANT
ADH SKN CLS APL DERMABOND .7 (GAUZE/BANDAGES/DRESSINGS) ×1
ADPR TBG 2 SPK PMP STRL ASCP (ADAPTER) ×2
ANCH SUT 2X2.3 2 STRN TPE (Anchor) ×1 IMPLANT
ANCH SUT SWLK 19.1X4.75 (Anchor) ×1 IMPLANT
ANCHOR 3.9 PEEK 3 CORKSCREW (Anchor) ×1 IMPLANT
ANCHOR ICONIX SPEED 2.0 TAPE (Anchor) ×1 IMPLANT
ANCHOR ICONIX SPEED 2.3 (Anchor) ×1 IMPLANT
ANCHOR SUT BIO SW 4.75X19.1 (Anchor) ×1 IMPLANT
ANCHOR TENDON ABSORBABLE (Anchor) ×1 IMPLANT
APL PRP STRL LF DISP 70% ISPRP (MISCELLANEOUS) ×1
BUR BR 5.5 12 FLUTE (BURR) ×2 IMPLANT
BUR RADIUS 4.0X18.5 (BURR) ×2 IMPLANT
CANNULA PART THRD DISP 5.75X7 (CANNULA) ×2 IMPLANT
CANNULA PARTIAL THREAD 2X7 (CANNULA) ×2 IMPLANT
CHLORAPREP W/TINT 26 (MISCELLANEOUS) ×2 IMPLANT
COOLER POLAR GLACIER W/PUMP (MISCELLANEOUS) ×2 IMPLANT
COVER LIGHT HANDLE UNIVERSAL (MISCELLANEOUS) ×4 IMPLANT
DERMABOND ADVANCED (GAUZE/BANDAGES/DRESSINGS) ×1
DERMABOND ADVANCED .7 DNX12 (GAUZE/BANDAGES/DRESSINGS) ×1 IMPLANT
DRAPE IMP U-DRAPE 54X76 (DRAPES) ×3 IMPLANT
DRAPE INCISE IOBAN 66X45 STRL (DRAPES) ×2 IMPLANT
DRAPE U-SHAPE 48X52 POLY STRL (PACKS) ×4 IMPLANT
DRSG TEGADERM 4X4.75 (GAUZE/BANDAGES/DRESSINGS) ×8 IMPLANT
ELECT REM PT RETURN 9FT ADLT (ELECTROSURGICAL) ×2
ELECTRODE REM PT RTRN 9FT ADLT (ELECTROSURGICAL) ×1 IMPLANT
GAUZE SPONGE 4X4 12PLY STRL (GAUZE/BANDAGES/DRESSINGS) ×2 IMPLANT
GAUZE XEROFORM 1X8 LF (GAUZE/BANDAGES/DRESSINGS) ×2 IMPLANT
GOWN STRL REIN 2XL XLG LVL4 (GOWN DISPOSABLE) ×2 IMPLANT
GOWN STRL REUS W/ TWL LRG LVL3 (GOWN DISPOSABLE) ×1 IMPLANT
GOWN STRL REUS W/TWL LRG LVL3 (GOWN DISPOSABLE) ×4
GRAFT TISS 40X70 1 THK DERM (Miscellaneous) IMPLANT
IV LACTATED RINGER IRRG 3000ML (IV SOLUTION) ×16
IV LR IRRIG 3000ML ARTHROMATIC (IV SOLUTION) ×8 IMPLANT
KIT CORKSCREW KNTLS 3.9 S/T/P (INSTRUMENTS) ×1 IMPLANT
KIT STABILIZATION SHOULDER (MISCELLANEOUS) ×2 IMPLANT
KIT TURNOVER KIT A (KITS) ×2 IMPLANT
MANIFOLD 4PT FOR NEPTUNE1 (MISCELLANEOUS) ×2 IMPLANT
MASK FACE SPIDER DISP (MASK) ×2 IMPLANT
MAT ABSORB  FLUID 56X50 GRAY (MISCELLANEOUS) ×2
MAT ABSORB FLUID 56X50 GRAY (MISCELLANEOUS) ×2 IMPLANT
PACK ARTHROSCOPY SHOULDER (MISCELLANEOUS) ×2 IMPLANT
PAD WRAPON POLAR SHDR XLG (MISCELLANEOUS) ×1 IMPLANT
PASSER SUT FIRSTPASS SELF (INSTRUMENTS) ×1 IMPLANT
PATCH DERMIS DECELL ARTHRO 40 (Miscellaneous) ×2 IMPLANT
PENCIL SMOKE EVACUATOR (MISCELLANEOUS) ×2 IMPLANT
SET TUBE SUCT SHAVER OUTFL 24K (TUBING) ×2 IMPLANT
SLEEVE PROTECTION STRL DISP (MISCELLANEOUS) ×1 IMPLANT
SPONGE GAUZE 2X2 8PLY STRL LF (GAUZE/BANDAGES/DRESSINGS) ×2 IMPLANT
SPREADER GRAFT (MISCELLANEOUS) ×1 IMPLANT
SUT 0 TIGERLINK 1.5 FWIRE CLS (SUTURE) ×4
SUT ETHILON 3-0 (SUTURE) ×2 IMPLANT
SUT MNCRL 4-0 (SUTURE) ×2
SUT MNCRL 4-0 27XMFL (SUTURE) ×1
SUT VIC AB 0 CT1 36 (SUTURE) ×2 IMPLANT
SUT VIC AB 2-0 CT2 27 (SUTURE) ×2 IMPLANT
SUTURE 0 TIGRLNK 1.5 FWIR CLS (SUTURE) IMPLANT
SUTURE MNCRL 4-0 27XMF (SUTURE) ×1 IMPLANT
SUTURE TAPE 1.3 40 TPR END (SUTURE) IMPLANT
SUTURETAPE 1.3 40 TPR END (SUTURE) ×2
SUTURETAPE 1.3 40 W/NDL BLUE (SUTURE) ×1 IMPLANT
SYSTEM FBRTK BICEPS 1.9 DRILL (Anchor) ×1 IMPLANT
TAPE MICROFOAM 4IN (TAPE) ×2 IMPLANT
TUBING ARTHRO INFLOW-ONLY STRL (TUBING) ×2 IMPLANT
TUBING CONNECTING 10 (TUBING) ×2 IMPLANT
WAND WEREWOLF FLOW 90D (MISCELLANEOUS) ×2 IMPLANT
WRAPON POLAR PAD SHDR XLG (MISCELLANEOUS) ×2

## 2020-11-16 NOTE — Anesthesia Procedure Notes (Signed)
Anesthesia Regional Block: Interscalene brachial plexus block   Pre-Anesthetic Checklist: ,, timeout performed, Correct Patient, Correct Site, Correct Laterality, Correct Procedure, Correct Position, site marked, Risks and benefits discussed,  Surgical consent,  Pre-op evaluation,  At surgeon's request and post-op pain management  Laterality: Right  Prep: chloraprep       Needles:  Injection technique: Single-shot  Needle Type: Stimiplex     Needle Length: 10cm  Needle Gauge: 21     Additional Needles:   Procedures:,,,, ultrasound used (permanent image in chart),,,,  Narrative:  Start time: 11/16/2020 6:58 AM End time: 11/16/2020 7:02 AM Injection made incrementally with aspirations every 5 mL.  Performed by: Personally   Additional Notes: Functioning IV was confirmed and monitors applied. Ultrasound guidance: relevant anatomy identified, needle position confirmed, local anesthetic spread visualized around nerve(s)., vascular puncture avoided.  Image printed for medical record.  Negative aspiration and no paresthesias; incremental administration of local anesthetic. The patient tolerated the procedure well. Vitals signes recorded in RN notes.

## 2020-11-16 NOTE — Discharge Instructions (Signed)
Post-Op Instructions - Rotator Cuff Repair  1. Bracing: You will wear a shoulder immobilizer or sling for 6 weeks.   2. Driving: No driving for 3 weeks post-op. When driving, do not wear the immobilizer. Ideally, we recommend no driving for 6 weeks while sling is in place as one arm will be immobilized.   3. Activity: No active lifting for 2 months. Wrist, hand, and elbow motion only. Avoid lifting the upper arm away from the body except for hygiene. You are permitted to bend and straighten the elbow passively only (no active elbow motion). You may use your hand and wrist for typing, writing, and managing utensils (cutting food). Do not lift more than a coffee cup for 8 weeks.  When sleeping or resting, inclined positions (recliner chair or wedge pillow) and a pillow under the forearm for support may provide better comfort for up to 4 weeks.  Avoid long distance travel for 4 weeks.  Return to normal activities after rotator cuff repair repair normally takes 6 months on average. If rehab goes very well, may be able to do most activities at 4 months, except overhead or contact sports.  4. Physical Therapy: Begins 3-4 days after surgery, and proceed 1 time per week for the first 6 weeks, then 1-2 times per week from weeks 6-20 post-op.  5. Medications:  - You will be provided a prescription for narcotic pain medicine. After surgery, take 1-2 narcotic tablets every 4 hours if needed for severe pain.  - A prescription for anti-nausea medication will be provided in case the narcotic medicine causes nausea - take 1 tablet every 6 hours only if nauseated.   - Take tylenol 1000 mg (2 Extra Strength tablets or 3 regular strength) every 8 hours for pain.  May decrease or stop tylenol 5 days after surgery if you are having minimal pain. - Take ASA 325mg/day x 2 weeks to help prevent DVTs/PEs (blood clots).  - DO NOT take ANY nonsteroidal anti-inflammatory pain medications (Advil, Motrin, Ibuprofen, Aleve,  Naproxen, or Naprosyn). These medicines can inhibit healing of your shoulder repair.    If you are taking prescription medication for anxiety, depression, insomnia, muscle spasm, chronic pain, or for attention deficit disorder, you are advised that you are at a higher risk of adverse effects with use of narcotics post-op, including narcotic addiction/dependence, depressed breathing, death. If you use non-prescribed substances: alcohol, marijuana, cocaine, heroin, methamphetamines, etc., you are at a higher risk of adverse effects with use of narcotics post-op, including narcotic addiction/dependence, depressed breathing, death. You are advised that taking > 50 morphine milligram equivalents (MME) of narcotic pain medication per day results in twice the risk of overdose or death. For your prescription provided: oxycodone 5 mg - taking more than 6 tablets per day would result in > 50 morphine milligram equivalents (MME) of narcotic pain medication. Be advised that we will prescribe narcotics short-term, for acute post-operative pain only - 3 weeks for major operations such as shoulder repair/reconstruction surgeries.     6. Post-Op Appointment:  Your first post-op appointment will be 10-14 days post-op.  7. Work or School: For most, but not all procedures, we advise staying out of work or school for at least 1 to 2 weeks in order to recover from the stress of surgery and to allow time for healing.   If you need a work or school note this can be provided.   8. Smoking: If you are a smoker, you need to refrain from   smoking in the postoperative period. The nicotine in cigarettes will inhibit healing of your shoulder repair and decrease the chance of successful repair. Similarly, nicotine containing products (gum, patches) should be avoided.   Post-operative Brace: Apply and remove the brace you received as you were instructed to at the time of fitting and as described in detail as the brace's  instructions for use indicate.  Wear the brace for the period of time prescribed by your physician.  The brace can be cleaned with soap and water and allowed to air dry only.  Should the brace result in increased pain, decreased feeling (numbness/tingling), increased swelling or an overall worsening of your medical condition, please contact your doctor immediately.  If an emergency situation occurs as a result of wearing the brace after normal business hours, please dial 911 and seek immediate medical attention.  Let your doctor know if you have any further questions about the brace issued to you. Refer to the shoulder sling instructions for use if you have any questions regarding the correct fit of your shoulder sling.  [employer] Customer Care for Troubleshooting: [phone]  Video that illustrates how to properly use a shoulder sling: "Instructions for Proper Use of an Orthopaedic Sling" https://www.youtube.com/watch?v=AHZpn_Xo45w        General Anesthesia, Adult, Care After This sheet gives you information about how to care for yourself after your procedure. Your health care provider may also give you more specific instructions. If you have problems or questions, contact your health care provider. What can I expect after the procedure? After the procedure, the following side effects are common:  Pain or discomfort at the IV site.  Nausea.  Vomiting.  Sore throat.  Trouble concentrating.  Feeling cold or chills.  Feeling weak or tired.  Sleepiness and fatigue.  Soreness and body aches. These side effects can affect parts of the body that were not involved in surgery. Follow these instructions at home: For the time period you were told by your health care provider:  Rest.  Do not participate in activities where you could fall or become injured.  Do not drive or use machinery.  Do not drink alcohol.  Do not take sleeping pills or medicines that cause drowsiness.  Do not  make important decisions or sign legal documents.  Do not take care of children on your own.   Eating and drinking  Follow any instructions from your health care provider about eating or drinking restrictions.  When you feel hungry, start by eating small amounts of foods that are soft and easy to digest (bland), such as toast. Gradually return to your regular diet.  Drink enough fluid to keep your urine pale yellow.  If you vomit, rehydrate by drinking water, juice, or clear broth. General instructions  If you have sleep apnea, surgery and certain medicines can increase your risk for breathing problems. Follow instructions from your health care provider about wearing your sleep device: ? Anytime you are sleeping, including during daytime naps. ? While taking prescription pain medicines, sleeping medicines, or medicines that make you drowsy.  Have a responsible adult stay with you for the time you are told. It is important to have someone help care for you until you are awake and alert.  Return to your normal activities as told by your health care provider. Ask your health care provider what activities are safe for you.  Take over-the-counter and prescription medicines only as told by your health care provider.  If you smoke, do   not smoke without supervision.  Keep all follow-up visits as told by your health care provider. This is important. Contact a health care provider if:  You have nausea or vomiting that does not get better with medicine.  You cannot eat or drink without vomiting.  You have pain that does not get better with medicine.  You are unable to pass urine.  You develop a skin rash.  You have a fever.  You have redness around your IV site that gets worse. Get help right away if:  You have difficulty breathing.  You have chest pain.  You have blood in your urine or stool, or you vomit blood. Summary  After the procedure, it is common to have a sore throat  or nausea. It is also common to feel tired.  Have a responsible adult stay with you for the time you are told. It is important to have someone help care for you until you are awake and alert.  When you feel hungry, start by eating small amounts of foods that are soft and easy to digest (bland), such as toast. Gradually return to your regular diet.  Drink enough fluid to keep your urine pale yellow.  Return to your normal activities as told by your health care provider. Ask your health care provider what activities are safe for you. This information is not intended to replace advice given to you by your health care provider. Make sure you discuss any questions you have with your health care provider. Document Revised: 04/19/2020 Document Reviewed: 11/17/2019 Elsevier Patient Education  2021 [employer]  

## 2020-11-16 NOTE — Anesthesia Procedure Notes (Signed)
Procedure Name: General with mask airway Date/Time: 11/16/2020 7:40 AM Performed by: Jinny Blossom, CRNA Pre-anesthesia Checklist: Patient identified, Emergency Drugs available, Suction available, Patient being monitored and Timeout performed Patient Re-evaluated:Patient Re-evaluated prior to induction Oxygen Delivery Method: Simple face mask Preoxygenation: Pre-oxygenation with 100% oxygen Induction Type: IV induction

## 2020-11-16 NOTE — Op Note (Signed)
SURGERY DATE: 11/16/2020  PRE-OP DIAGNOSIS:  1. Right rotator cuff tear (subscapularis, supraspinatus, infraspinatus) 2. Right subacromial impingement 3. Right biceps tendinopathy  POST-OP DIAGNOSIS: 1. Right rotator cuff tear (subscapularis, supraspinatus) 2. Right subacromial impingement 3. Right biceps tendinopathy  PROCEDURES:  1. Right arthroscopic rotator cuff repair (subscapularis) 2. Right mini-open rotator cuff repair (supraspinatus) with allograft augmentation 3. Right open biceps tenodesis 4. Right arthroscopic extensive debridement of shoulder (glenohumeral and subacromial spaces) 5. Right arthroscopic subacromial decompression  SURGEON: Rosealee Albee, MD  ASSISTANT: Sonny Dandy, PA  ANESTHESIA: Gen with Exparil interscalene block  ESTIMATED BLOOD LOSS: 25cc  DRAINS:  none  TOTAL IV FLUIDS: per anesthesia   SPECIMENS: none  IMPLANTS:  - Arthrex 3.64mm Knotless Corkscrew x1 - Arthrex Knotless 4.13mm SwiveLock x 2 - Arthrex FiberTak Suture Anchor - Double Loaded - Iconix SPEED double loaded with 1.2 and 2.107mm tape x 2 - Arthroflex 74mm allograft patch (CuffMend system)  OPERATIVE FINDINGS:  Examination under anesthesia: A careful examination under anesthesia was performed.  Passive range of motion was: FF: 150; ER at side: 45; ER in abduction: 90; IR in abduction: 50.  Anterior load shift: NT.  Posterior load shift: NT.  Sulcus in neutral: NT.  Sulcus in ER: NT.    Intra-operative findings: A thorough arthroscopic examination of the shoulder was performed.  The findings are: 1. Biceps tendon: Severe fraying and flattening of the proximal biceps tendon at the level of the biceps anchor 2. Superior labrum: injected with surrounding synovitis 3. Posterior labrum and capsule: normal 4. Inferior capsule and inferior recess: normal 5. Glenoid cartilage surface: Normal 6. Supraspinatus attachment: full-thickness tear 7. Posterior rotator cuff attachment: Normal 8.  Humeral head articular cartilage: normal 9. Rotator interval: significant synovitis 10: Subscapularis tendon: Full-thickness tear of the superior border 11. Anterior labrum: degenerative 12. IGHL: normal  OPERATIVE REPORT:   Indications for procedure: Mark Davenport is a 70 y.o. male with chronic R shoulder pain. Since that time, the patient has failed non-operative management including activity modification and medical management without adequate relief of symptoms. Clinical exam and MRI were suggestive of rotator cuff tear including subscapularis and supraspinatus tears; subacromial impingement; and significant biceps tendinopathy.  Additionally, there is significant supraspinatus atrophy suggestive of a more chronic tear.  Given these findings, we decided to proceed with surgical management. After discussion of risks, benefits, and alternatives to surgery, the patient elected to proceed.   Procedure in detail:  I identified Mark Davenport in the pre-operative holding area.  I marked the operative shoulder with my initials. I reviewed the risks and benefits of the proposed surgical intervention, and the patient (and/or patient's guardian) wished to proceed.  Anesthesia was then performed with an Exparel interscalene block.  The patient was transferred to the operative suite and placed in the beach chair position.    SCDs were placed on the lower extremities. Appropriate IV antibiotics were administered prior to incision. The operative upper extremity was then prepped and draped in standard fashion. A time out was performed confirming the correct extremity, correct patient, and correct procedure.   I then created a standard posterior portal with an 11 blade. The glenohumeral joint was easily entered with a blunt trocar and the arthroscope introduced. The findings of diagnostic arthroscopy are described above.  A standard anterior portal was made.  The proximal biceps tendon was significantly frayed and  was blocking visualization.  Therefore, the frayed edges were debrided first with an oscillating shaver to improve  visualization.  I then debrided degenerative tissue including the synovitic tissue about the rotator interval and IGHL as well as the anterior and superior labrum. I then coagulated the inflamed synovium to obtain hemostasis and reduce the risk of post-operative swelling using an Arthrocare radiofrequency device. The biceps tendon was cut at its insertion on the superior labrum with arthroscopic scissors.  An ArthroCare wand was used to debride the proximal biceps anchor back to the level of the superior labrum.  The subscapularis tear was identified.  Tissue about the subscapularis was released anteriorly, superiorly, and posteriorly to allow for improved mobilization.  The subscapularis could easily be reduced back to its footprint.  The lesser tuberosity footprint was prepared with a combination of electrocautery and a burr.  An Arthrex knotless corkscrew was placed into the lesser tuberosity footprint from the anterior portal.  A BirdBeak suture passing device was used to pass the passing suture through the torn portion of the subscapularis.  The suture was shuttled through the anchor. With the arm in neutral rotation, the repair was tensioned appropriately. This appropriately reduced the subscapularis tear.  The arm was then internally and externally rotated and the subscapularis was noted to move appropriately with rotation.  The remainder of the suture was then cut.  Next, the arthroscope was then introduced into the subacromial space.  An extensive subacromial bursectomy and debridement of the subacromial bursa was performed using a combination of the shaver and Arthrocare wand. The entire acromial undersurface was exposed and the CA ligament was subperiosteally elevated to expose the anterior acromial hook. A 5.51mm barrel burr was used to create a flat anterior and lateral aspect of the  acromion, converting it from a Type 2 to a Type 1 acromion. Care was made to keep the deltoid fascia intact.  This concluded the arthroscopic portion of the procedure.  A longitudinal incision from the anterolateral acromion ~6cm in length was made overlying the raphe between the anterior and middle heads of the deltoid.  This incision also incorporated the anterolateral portal.  The raphe was identified and it was incised. The subacromial space was identified. Any remaining bursa was excised. The rotator cuff tear involving the supraspinatus and part of the infraspinatus was identified. It was an L-shaped tear with the long limb of the L posterior.   We then turned our attention to the biceps tenodesis. The arm was externally rotated.  The bicipital groove was identified.  A 15 blade was used to make a cut overlying the biceps tendon, and the tendon was removed using a right angle clamp.  The base of the bicipital groove was identified and cleared of soft tissue.  A FiberTak anchor was placed in the bicipital groove.  The biceps tendon was held at the appropriate amount of tension.  One set of sutures was passed through the biceps anchor with one limb passed in a simple fashion and the second limb passed in a simple plus locking stitch pattern.  This was repeated for the other set of sutures.  This construct allowed for shuttling the biceps tendon down to the bone.  The sutures were tied and cut.  The diseased portion of the proximal biceps was then excised.  The arm was then internally rotated.  The rotator cuff footprint was cleared of soft tissue. The footprint was smoothed and a rongeur was used to create a smooth, bleeding bed. Two Iconix SPEED anchors were placed just lateral to the articular margin.  Next, 5 margin convergence sutures  were placed and then tied to reduce the longitudinal limb of the L with the posterior supraspinatus to the anterior infraspinatus.  All limbs of suture from the medial  row anchors were passed through the rotator cuff with a suture passer. Two SwiveLock anchors were placed for the lateral row anchors with anterior medial row sutures were loaded onto the posterior anchor and posterior medial row sutures loaded onto the anterior anchor.  This allowed for excellent reapproximation and compression of the rotator cuff over its footprint.  There was approximately 5 mm of uncovered footprint laterally.  Given this as well as the size of the tear, decision was made to augment the repair with a 1 mm dermal allograft patch.  It was cut to appropriate size and medial fixation was achieved with tendon to tendon staples.  Lateral fixation was achieved by utilizing the knotless mechanism from the lateral row SwiveLock anchors.  This allowed for appropriate fixation of the allograft on top of the rotator cuff repair.  The wound was thoroughly irrigated.  The deltoid split was closed with 0 Vicryl.  The subdermal layer was closed with 2-0 Vicryl.  The skin was closed with 4-0 Monocryl and Dermabond. The portals were closed with 3-0 Nylon. Xeroform was applied to the incisions. A sterile dressing was applied, followed by a Polar Care sleeve and a SlingShot shoulder immobilizer/sling. The patient was awakened from anesthesia without difficulty and was transferred to the PACU in stable condition.   Of note, assistance from a PA was essential to performing the surgery. PA assisted with patient positioning, retraction, and instrumentation. The surgery would have been more difficult and had longer operative time without PA assistance.   Of note, this mini open rotator cuff repair had increased complexity compared to standard mini open rotator cuff repair.  This was due to the need to place 5 margin convergence sutures with appropriate knot tying in order to better reduce the rotator cuff prior to suture passage from the medial row anchors.  Additionally, given the size of the tear and slight  undercoverage of the rotator cuff footprint, allograft patch was placed.  These factors added approximately 30 minutes to the surgical time.  COMPLICATIONS: none  DISPOSITION: plan for discharge home after recovery in PACU   POSTOPERATIVE PLAN: Remain in sling (except hygiene and elbow/wrist/hand RoM exercises as instructed by PT) x 6 weeks and NWB for this time. PT to begin 3-4 days after surgery.  Use large rotator cuff repair rehab protocol with subscapularis repair.  ASA 325mg  daily x 2 weeks for DVT ppx.

## 2020-11-16 NOTE — Transfer of Care (Signed)
Immediate Anesthesia Transfer of Care Note  Patient: Mark Davenport  Procedure(s) Performed: Right shoulder arthroscopic sunscapularis repair, mini-open rotator cuff repair with possible augmentation versus partial repair with subacromial balloon spacer placement, and biceps tenodesis - Dedra Skeens to Assist (Right Shoulder)  Patient Location: PACU  Anesthesia Type: General  Level of Consciousness: awake, alert  and patient cooperative  Airway and Oxygen Therapy: Patient Spontanous Breathing and Patient connected to supplemental oxygen  Post-op Assessment: Post-op Vital signs reviewed, Patient's Cardiovascular Status Stable, Respiratory Function Stable, Patent Airway and No signs of Nausea or vomiting  Post-op Vital Signs: Reviewed and stable  Complications: No complications documented.

## 2020-11-16 NOTE — H&P (Signed)
Paper H&P to be scanned into permanent record. H&P reviewed. No significant changes noted.  

## 2020-11-16 NOTE — Anesthesia Postprocedure Evaluation (Signed)
Anesthesia Post Note  Patient: Mark Davenport  Procedure(s) Performed: Right shoulder arthroscopic sunscapularis repair, mini-open rotator cuff repair with possible augmentation versus partial repair with subacromial balloon spacer placement, and biceps tenodesis - Dedra Skeens to Assist (Right Shoulder)     Patient location during evaluation: PACU Anesthesia Type: General Level of consciousness: awake and alert Pain management: pain level controlled Vital Signs Assessment: post-procedure vital signs reviewed and stable Respiratory status: spontaneous breathing, nonlabored ventilation, respiratory function stable and patient connected to nasal cannula oxygen Cardiovascular status: blood pressure returned to baseline and stable Postop Assessment: no apparent nausea or vomiting Anesthetic complications: no   No complications documented.  Marlisha Vanwyk, Salvadore Dom

## 2020-11-16 NOTE — Anesthesia Preprocedure Evaluation (Signed)
Anesthesia Evaluation  Patient identified by MRN, date of birth, ID band Patient awake    Reviewed: Allergy & Precautions, H&P , NPO status , Patient's Chart, lab work & pertinent test results, reviewed documented beta blocker date and time   History of Anesthesia Complications (+) PONV and history of anesthetic complications (PONV)  Airway Mallampati: III  TM Distance: >3 FB Neck ROM: full    Dental  (+) Dental Advidsory Given, Caps, Missing, Teeth Intact   Pulmonary neg shortness of breath, sleep apnea and Continuous Positive Airway Pressure Ventilation , neg COPD, neg recent URI,           Cardiovascular Exercise Tolerance: Good negative cardio ROS       Neuro/Psych negative neurological ROS  negative psych ROS   GI/Hepatic negative GI ROS, Neg liver ROS,   Endo/Other  negative endocrine ROS  Renal/GU negative Renal ROS  negative genitourinary   Musculoskeletal   Abdominal   Peds  Hematology negative hematology ROS (+)   Anesthesia Other Findings Past Medical History: No date: Arthritis No date: BPH (benign prostatic hyperplasia) No date: Complication of anesthesia No date: Hypercholesteremia No date: PONV (postoperative nausea and vomiting) No date: Sleep apnea     Comment:  OSA/CPAP   Reproductive/Obstetrics negative OB ROS                             Anesthesia Physical  Anesthesia Plan  ASA: II  Anesthesia Plan: General   Post-op Pain Management:  Regional for Post-op pain   Induction: Intravenous  PONV Risk Score and Plan: 3 and Propofol infusion, TIVA, Ondansetron, Dexamethasone and Treatment may vary due to age or medical condition  Airway Management Planned: LMA  Additional Equipment:   Intra-op Plan:   Post-operative Plan: Extubation in OR  Informed Consent: I have reviewed the patients History and Physical, chart, labs and discussed the procedure  including the risks, benefits and alternatives for the proposed anesthesia with the patient or authorized representative who has indicated his/her understanding and acceptance.     Dental Advisory Given  Plan Discussed with: Anesthesiologist, CRNA and Surgeon  Anesthesia Plan Comments:         Anesthesia Quick Evaluation

## 2020-11-16 NOTE — Progress Notes (Signed)
Assisted Almedia Balls, ANMD with right, ultrasound guided, interscalene  block. Side rails up, monitors on throughout procedure. See vital signs in flow sheet. Tolerated Procedure well.

## 2020-11-19 ENCOUNTER — Encounter: Payer: Self-pay | Admitting: Orthopedic Surgery

## 2021-09-27 ENCOUNTER — Other Ambulatory Visit: Payer: Self-pay | Admitting: Surgery

## 2021-09-27 DIAGNOSIS — M1711 Unilateral primary osteoarthritis, right knee: Secondary | ICD-10-CM

## 2021-10-02 ENCOUNTER — Ambulatory Visit
Admission: RE | Admit: 2021-10-02 | Discharge: 2021-10-02 | Disposition: A | Discharge status: Home / Self Care | Payer: Medicare HMO | Source: Ambulatory Visit | Attending: Surgery | Admitting: Surgery

## 2021-10-02 DIAGNOSIS — M1711 Unilateral primary osteoarthritis, right knee: Secondary | ICD-10-CM

## 2021-10-02 IMAGING — MR MR KNEE*R* W/O CM
5 of 6 series · 30 of 40 positions shown · non-contrast
Comparison: None.

CLINICAL DATA: Right knee pain for several years.

EXAM:
MRI OF THE RIGHT KNEE WITHOUT CONTRAST
TECHNIQUE: Multiplanar, multisequence MR imaging of the knee was performed. No
intravenous contrast was administered.

[Series 3: T2 fat-sat · axial · right · 4.0mm · 0.29mm/px · z∈[-92,+58]mm · 6 of 31 slices shown (1 of 3)]
[im 1/31]
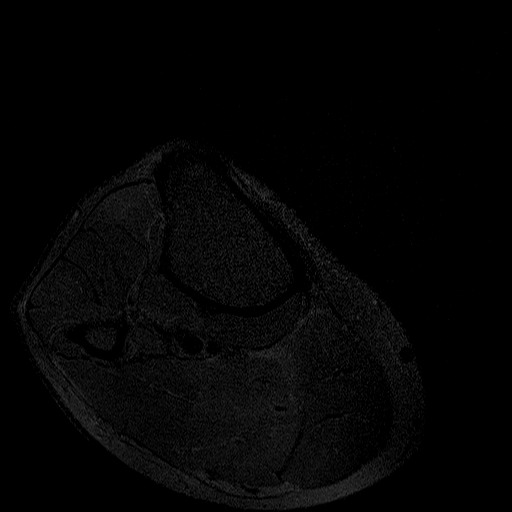
[im 7/31]
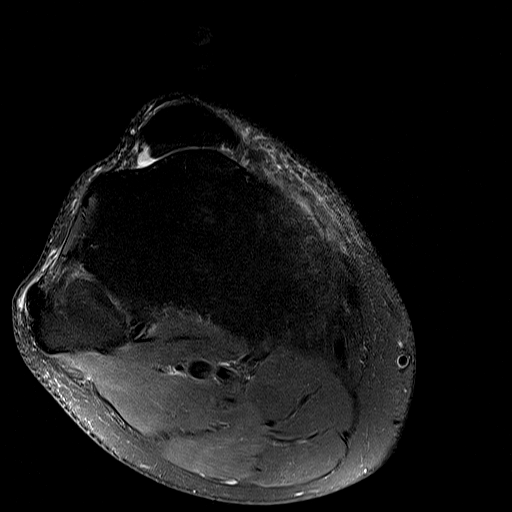
[im 13/31]
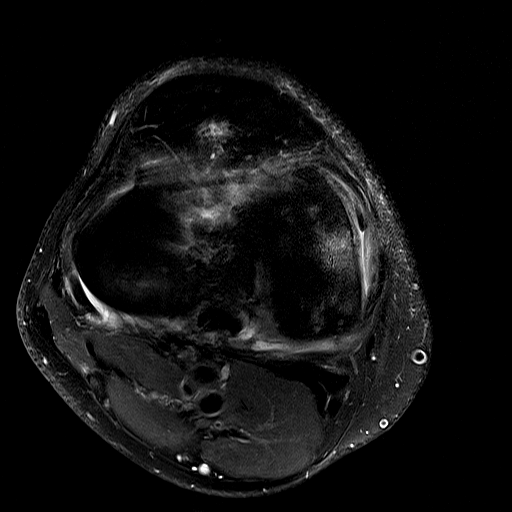
[im 19/31]
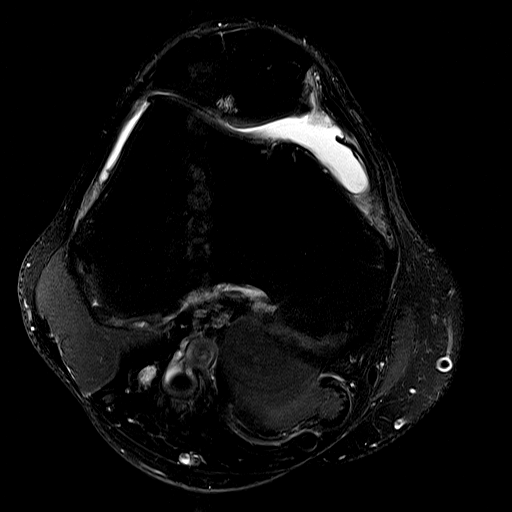
[im 25/31]
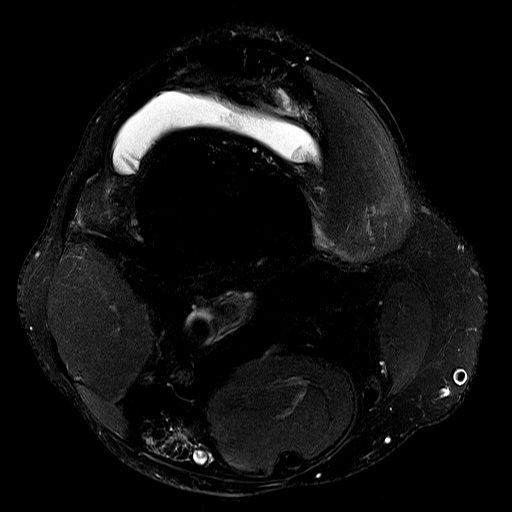
[im 31/31]
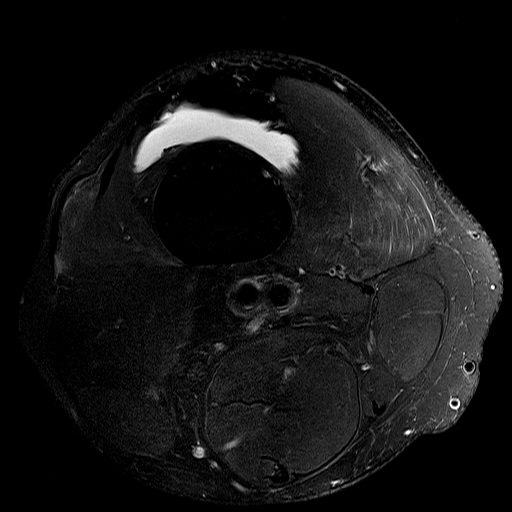

[Series 5: T2 fat-sat · coronal · right · 4.0mm · 0.50mm/px · 6 of 25 slices shown (2 of 3)]
[im 1/25]
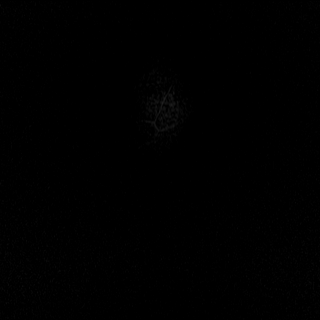
[im 5/25]
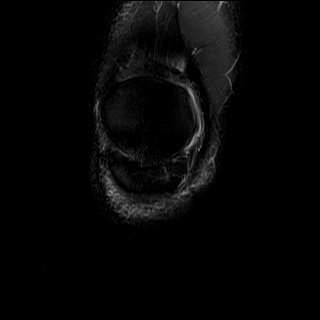
[im 10/25]
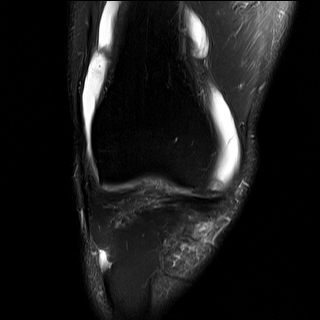
[im 15/25]
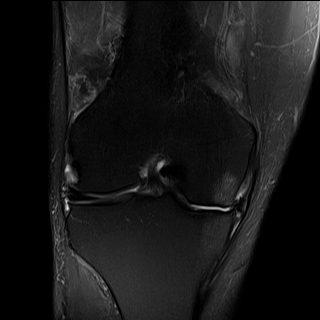
[im 20/25]
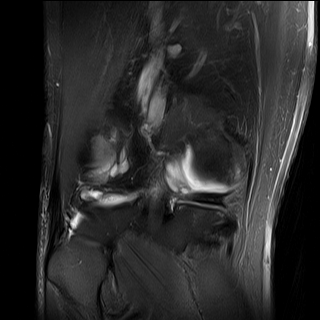
[im 25/25]
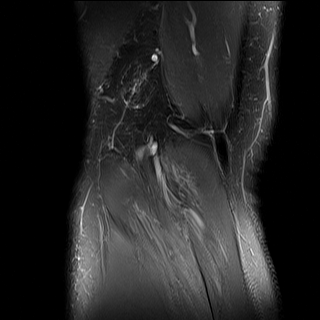

[Series 7: PD fat-sat · sagittal · right · 3.0mm · 0.47mm/px · 7 of 31 slices shown (1 of 2)]
[im 1/31]
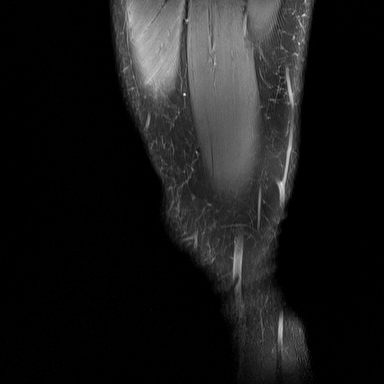
[im 6/31]
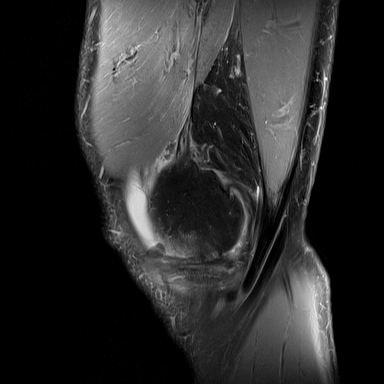
[im 11/31]
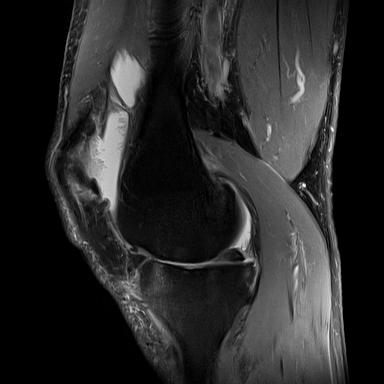
[im 16/31]
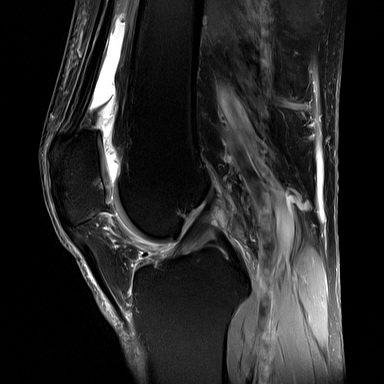
[im 21/31]
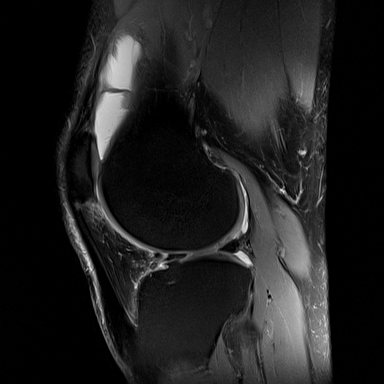
[im 26/31]
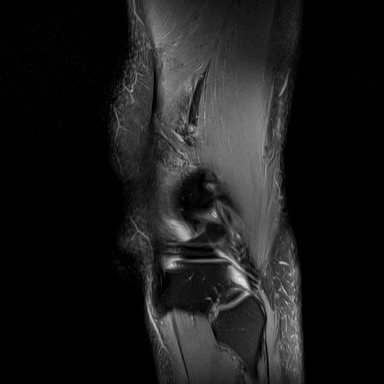
[im 31/31]
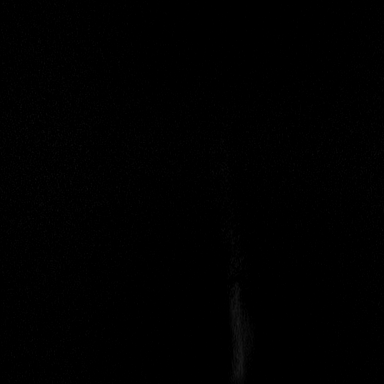

[Series 8: T2 fat-sat · sagittal · right · 3.0mm · 0.35mm/px · 3 of 31 slices shown (3 of 3)]
[im 1/31]
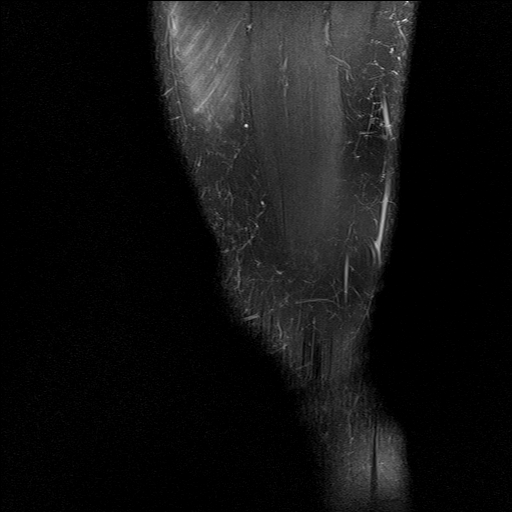
[im 6/31]
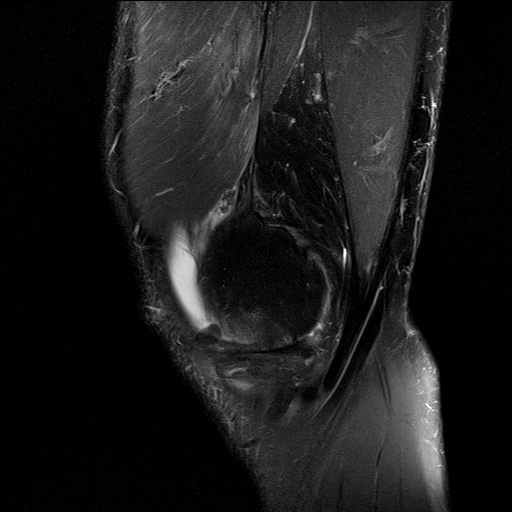
[im 11/31]
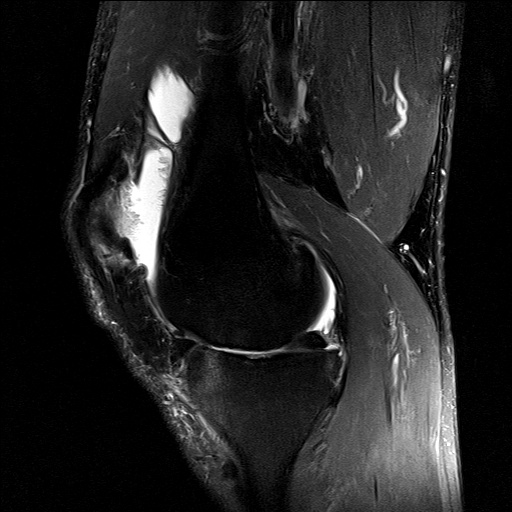

[Series 9: PD fat-sat · coronal · right · 3.0mm · 0.56mm/px · 8 of 33 slices shown (2 of 2)]
[im 1/33]
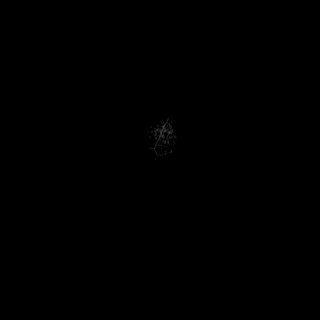
[im 5/33]
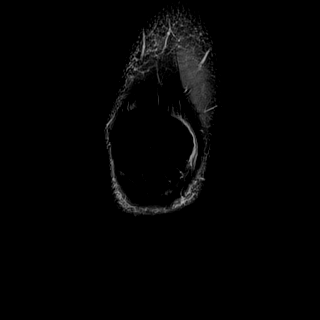
[im 10/33]
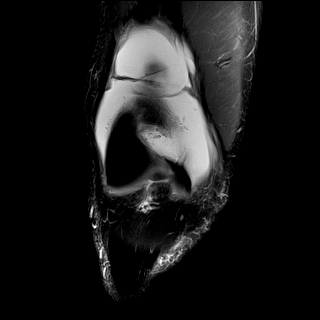
[im 14/33]
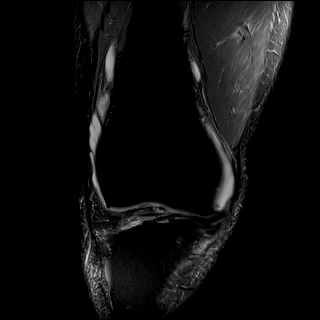
[im 19/33]
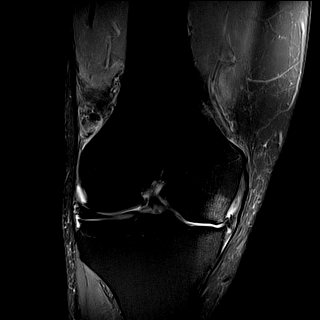
[im 23/33]
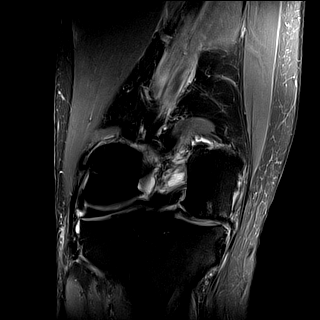
[im 28/33]
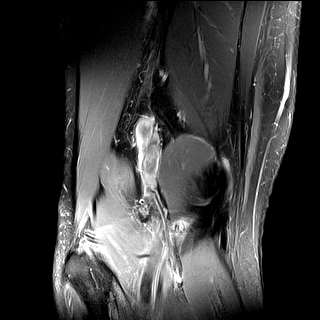
[im 33/33]
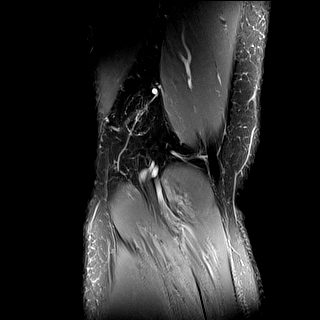

[30 of 40 positions shown; findings below may reference images not displayed]

FINDINGS: MENISCI

Medial: Maceration of the body of the medial meniscus. Oblique tear
of the posterior horn of the medial meniscus extending to the
inferior articular surface.

Lateral: Intact.

LIGAMENTS

Cruciates: ACL and PCL are intact.

Collaterals: Medial collateral ligament is intact. Lateral
collateral ligament complex is intact.

CARTILAGE

Patellofemoral: Partial-thickness cartilage loss of the lateral
patellofemoral compartment with an area of full-thickness cartilage
fissuring of the lateral patellar facet with subchondral reactive
marrow changes.

Medial: Full-thickness cartilage loss of the medial femorotibial
compartment with subchondral reactive marrow edema.

Lateral:  No chondral defect.

JOINT: Large joint effusion. Mild edema in Hoffa's fat. No plical
thickening.

POPLITEAL FOSSA: Popliteus tendon is intact. No Baker's cyst.

EXTENSOR MECHANISM: Intact quadriceps tendon. Intact patellar
tendon. Intact lateral patellar retinaculum. Intact medial patellar
retinaculum. Intact MPFL.

BONES: No aggressive osseous lesion. No fracture or dislocation.
Small tricompartmental marginal osteophytes.

Other: No fluid collection or hematoma. Muscles are normal.
IMPRESSION: 1. Maceration of the body of the medial meniscus. Oblique tear of
the posterior horn of the medial meniscus extending to the inferior
articular surface.
2. Partial-thickness cartilage loss of the lateral patellofemoral
compartment with an area of full-thickness cartilage fissuring of
the lateral patellar facet with subchondral reactive marrow changes.
3. Full-thickness cartilage loss of the medial femorotibial
compartment with subchondral reactive marrow edema.
4. Large joint effusion.

## 2021-10-08 ENCOUNTER — Other Ambulatory Visit: Payer: Self-pay | Admitting: Surgery

## 2021-10-11 ENCOUNTER — Other Ambulatory Visit
Admission: RE | Admit: 2021-10-11 | Discharge: 2021-10-11 | Disposition: A | Discharge status: Home / Self Care | Payer: Medicare HMO | Source: Ambulatory Visit | Attending: Surgery | Admitting: Surgery

## 2021-10-11 ENCOUNTER — Other Ambulatory Visit: Payer: Self-pay

## 2021-10-11 NOTE — Patient Instructions (Addendum)
Your procedure is scheduled on: 10/16/21 - Wednesday Report to the Registration Desk on the 1st floor of [facility]. To find out your arrival time, please call [phone] between 1PM - 3PM on: 10/15/21 - Tuesday Report to [facility] for Covid test / labs/EKG on 10/14/21 at 12 noon.  REMEMBER: Instructions that are not followed completely may result in serious medical risk, up to and including death; or upon the discretion of your surgeon and anesthesiologist your surgery may need to be rescheduled.  Do not eat food after midnight the night before surgery.  No gum chewing, lozengers or hard candies.  You may however, drink CLEAR liquids up to 2 hours before you are scheduled to arrive for your surgery. Do not drink anything within 2 hours of your scheduled arrival time.  Clear liquids include: - water  - apple juice without pulp - gatorade (not RED colors) - black coffee or tea (Do NOT add milk or creamers to the coffee or tea) Do NOT drink anything that is not on this list.  In addition, your doctor has ordered for you to drink the provided  Ensure Pre-Surgery Clear Carbohydrate Drink Drinking this carbohydrate drink up to two hours before surgery helps to reduce insulin resistance and improve patient outcomes. Please complete drinking 2 hours prior to scheduled arrival time.  TAKE THESE MEDICATIONS THE MORNING OF SURGERY WITH A SIP OF WATER:  - celecoxib (CELEBREX) 200 MG capsule - tamsulosin (FLOMAX) 0.4 MG CAPS capsule  One week prior to surgery: Stop Anti-inflammatories (NSAIDS) such as Advil, Aleve, Ibuprofen, Motrin, Naproxen, Naprosyn and Aspirin based products such as Excedrin, Goodys Powder, BC Powder.  Stop ANY OVER THE COUNTER supplements until after surgery.  You may however, continue to take Tylenol if needed for pain up until the day of surgery.  No Alcohol for 24 hours before or after surgery.  No Smoking including e-cigarettes for 24 hours  prior to surgery.  No chewable tobacco products for at least 6 hours prior to surgery.  No nicotine patches on the day of surgery.  Do not use any "recreational" drugs for at least a week prior to your surgery.  Please be advised that the combination of cocaine and anesthesia may have negative outcomes, up to and including death. If you test positive for cocaine, your surgery will be cancelled.  On the morning of surgery brush your teeth with toothpaste and water, you may rinse your mouth with mouthwash if you wish. Do not swallow any toothpaste or mouthwash.  Use CHG Soap or wipes as directed on instruction sheet.  Do not wear jewelry, make-up, hairpins, clips or nail polish.  Do not wear lotions, powders, or perfumes.   Do not shave body from the neck down 48 hours prior to surgery just in case you cut yourself which could leave a site for infection.  Also, freshly shaved skin may become irritated if using the CHG soap.  Contact lenses, hearing aids and dentures may not be worn into surgery.  Bring your CPAP machine with you to the hospital the day of your surgery .  Do not bring valuables to the hospital. [facility] is not responsible for any missing/lost belongings or valuables.   Notify your doctor if there is any change in your medical condition (cold, fever, infection).  Wear comfortable clothing (specific to your surgery type) to the hospital.  After surgery, you can help prevent lung complications by doing breathing exercises.  Take deep breaths  and cough every 1-2 hours. Your doctor may order a device called an Incentive Spirometer to help you take deep breaths. When coughing or sneezing, hold a pillow firmly against your incision with both hands. This is called splinting. Doing this helps protect your incision. It also decreases belly discomfort.  If you are being admitted to the hospital overnight, leave your suitcase in the car. After surgery it may be brought to  your room.  If you are being discharged the day of surgery, you will not be allowed to drive home. You will need a responsible adult (18 years or older) to drive you home and stay with you that night.   If you are taking public transportation, you will need to have a responsible adult (18 years or older) with you. Please confirm with your physician that it is acceptable to use public transportation.   Please call the Pre-admissions Testing Dept. at [phone] if you have any questions about these instructions.  Surgery Visitation Policy:  Patients undergoing a surgery or procedure may have one family member or support person with them as long as that person is not COVID-19 positive or experiencing its symptoms.  That person may remain in the waiting area during the procedure and may rotate out with other people.  Inpatient Visitation:    Visiting hours are 7 a.m. to 8 p.m. Up to two visitors ages 16+ are allowed at one time in a patient room. The visitors may rotate out with other people during the day. Visitors must check out when they leave, or other visitors will not be allowed. One designated support person may remain overnight. The visitor must pass COVID-19 screenings, use hand sanitizer when entering and exiting the patients room and wear a mask at all times, including in the patients room. Patients must also wear a mask when staff or their visitor are in the room. Masking is required regardless of vaccination status.

## 2021-10-14 ENCOUNTER — Encounter: Payer: Self-pay | Admitting: Urgent Care

## 2021-10-14 ENCOUNTER — Other Ambulatory Visit: Payer: Medicare HMO

## 2021-10-14 ENCOUNTER — Other Ambulatory Visit
Admission: RE | Admit: 2021-10-14 | Discharge: 2021-10-14 | Disposition: A | Discharge status: Home / Self Care | Payer: Medicare HMO | Source: Ambulatory Visit | Attending: Surgery | Admitting: Surgery

## 2021-10-14 ENCOUNTER — Other Ambulatory Visit: Payer: Self-pay

## 2021-10-14 DIAGNOSIS — Z01818 Encounter for other preprocedural examination: Secondary | ICD-10-CM | POA: Insufficient documentation

## 2021-10-14 DIAGNOSIS — Z20822 Contact with and (suspected) exposure to covid-19: Secondary | ICD-10-CM | POA: Insufficient documentation

## 2021-10-14 LAB — URINALYSIS, ROUTINE W REFLEX MICROSCOPIC
Bilirubin Urine: NEGATIVE
Glucose, UA: NEGATIVE mg/dL
Hgb urine dipstick: NEGATIVE
Ketones, ur: NEGATIVE mg/dL
Leukocytes,Ua: NEGATIVE
Nitrite: NEGATIVE
Protein, ur: NEGATIVE mg/dL
Specific Gravity, Urine: 1.011 (ref 1.005–1.030)
pH: 6 (ref 5.0–8.0)

## 2021-10-14 LAB — CBC WITH DIFFERENTIAL/PLATELET
Abs Immature Granulocytes: 0.02 10*3/uL (ref 0.00–0.07)
Basophils Absolute: 0 10*3/uL (ref 0.0–0.1)
Basophils Relative: 0 %
Eosinophils Absolute: 0.1 10*3/uL (ref 0.0–0.5)
Eosinophils Relative: 2 %
HCT: 44.1 % (ref 39.0–52.0)
Hemoglobin: 14.6 g/dL (ref 13.0–17.0)
Immature Granulocytes: 0 %
Lymphocytes Relative: 26 %
Lymphs Abs: 2 10*3/uL (ref 0.7–4.0)
MCH: 31.1 pg (ref 26.0–34.0)
MCHC: 33.1 g/dL (ref 30.0–36.0)
MCV: 93.8 fL (ref 80.0–100.0)
Monocytes Absolute: 0.6 10*3/uL (ref 0.1–1.0)
Monocytes Relative: 8 %
Neutro Abs: 4.8 10*3/uL (ref 1.7–7.7)
Neutrophils Relative %: 64 %
Platelets: 218 10*3/uL (ref 150–400)
RBC: 4.7 MIL/uL (ref 4.22–5.81)
RDW: 12.5 % (ref 11.5–15.5)
WBC: 7.6 10*3/uL (ref 4.0–10.5)
nRBC: 0 % (ref 0.0–0.2)

## 2021-10-14 LAB — COMPREHENSIVE METABOLIC PANEL
ALT: 23 U/L (ref 0–44)
AST: 24 U/L (ref 15–41)
Albumin: 4 g/dL (ref 3.5–5.0)
Alkaline Phosphatase: 67 U/L (ref 38–126)
Anion gap: 9 (ref 5–15)
BUN: 20 mg/dL (ref 8–23)
CO2: 24 mmol/L (ref 22–32)
Calcium: 9.5 mg/dL (ref 8.9–10.3)
Chloride: 104 mmol/L (ref 98–111)
Creatinine, Ser: 0.79 mg/dL (ref 0.61–1.24)
GFR, Estimated: >60 mL/min (ref >60)
Glucose, Bld: 90 mg/dL (ref 70–99)
Potassium: 3.8 mmol/L (ref 3.5–5.1)
Sodium: 137 mmol/L (ref 135–145)
Total Bilirubin: 0.5 mg/dL (ref 0.3–1.2)
Total Protein: 7 g/dL (ref 6.5–8.1)

## 2021-10-14 LAB — TYPE AND SCREEN
ABO/RH(D): A POS
Antibody Screen: NEGATIVE

## 2021-10-14 LAB — SARS CORONAVIRUS 2 (TAT 6-24 HRS): SARS Coronavirus 2: NEGATIVE

## 2021-10-15 NOTE — Anesthesia Preprocedure Evaluation (Addendum)
Anesthesia Evaluation  Patient identified by MRN, date of birth, ID band Patient awake    Reviewed: Allergy & Precautions, NPO status , Patient's Chart, lab work & pertinent test results  History of Anesthesia Complications (+) PONV and history of anesthetic complications  Airway Mallampati: III  TM Distance: >3 FB Neck ROM: full    Dental  (+) Missing   Pulmonary sleep apnea ,    Pulmonary exam normal        Cardiovascular negative cardio ROS Normal cardiovascular exam     Neuro/Psych negative neurological ROS  negative psych ROS   GI/Hepatic negative GI ROS, Neg liver ROS,   Endo/Other  negative endocrine ROS  Renal/GU      Musculoskeletal  (+) Arthritis , Osteoarthritis,    Abdominal Normal abdominal exam  (+)   Peds  Hematology negative hematology ROS (+)   Anesthesia Other Findings Past Medical History: No date: Arthritis     Comment:  Left ankle No date: BPH (benign prostatic hyperplasia) No date: Complication of anesthesia 08/22/2019: COVID-19 No date: Hypercholesteremia No date: PONV (postoperative nausea and vomiting)     Comment:  (x1 - thinks it was related to morphine) No date: Sleep apnea     Comment:  OSA/CPAP No date: Vertigo     Comment:  rare  Past Surgical History: 06/15/2018: EAR CYST EXCISION; Right     Comment:  Procedure: EXCISION  KNEE CYST;  Surgeon: Hessie Knows,              MD;  Location: [facility] ORS;  Service: Orthopedics;                Laterality: Right; 04/1978: FRACTURE SURGERY; Left     Comment:  ANKLE RECONSTRUCTION 03/2015: HAND SURGERY; Left     Comment:  Thumb No date: KNEE SURGERY     Comment:   CYST EXCISION 11/16/2020: SHOULDER ARTHROSCOPY WITH SUBACROMIAL DECOMPRESSION AND  OPEN ROTATOR C; Right     Comment:  Procedure: Right shoulder arthroscopic sunscapularis               repair, mini-open rotator cuff repair with possible               augmentation versus  partial repair with subacromial               balloon spacer placement, and biceps tenodesis - Reche Dixon to Assist;  Surgeon: Leim Fabry, MD;  Location:               [place];  Service: Orthopedics;  Laterality:               Right;  sleep apnea     Reproductive/Obstetrics negative OB ROS                            Anesthesia Physical Anesthesia Plan  ASA: 2  Anesthesia Plan: General/Spinal   Post-op Pain Management: Toradol IV (intra-op)*, Regional block* and Tylenol PO (pre-op)*   Induction: Intravenous  PONV Risk Score and Plan: 3 and Ondansetron, Dexamethasone, Propofol infusion, TIVA and Midazolam  Airway Management Planned: Natural Airway and Simple Face Mask  Additional Equipment:   Intra-op Plan:   Post-operative Plan:   Informed Consent: I have reviewed the patients History and Physical, chart, labs and discussed the procedure including the risks, benefits and alternatives for the  proposed anesthesia with the patient or authorized representative who has indicated his/her understanding and acceptance.     Dental advisory given  Plan Discussed with: Anesthesiologist, CRNA and Surgeon  Anesthesia Plan Comments:       Anesthesia Quick Evaluation

## 2021-10-16 ENCOUNTER — Encounter
Admission: RE | Disposition: A | Discharge status: Home / Self Care | Payer: Self-pay | Source: Home / Self Care | Attending: Surgery

## 2021-10-16 ENCOUNTER — Ambulatory Visit: Payer: Medicare HMO

## 2021-10-16 ENCOUNTER — Other Ambulatory Visit: Payer: Self-pay

## 2021-10-16 ENCOUNTER — Ambulatory Visit: Payer: Medicare HMO | Admitting: Anesthesiology

## 2021-10-16 ENCOUNTER — Ambulatory Visit
Admission: RE | Admit: 2021-10-16 | Discharge: 2021-10-16 | Disposition: A | Discharge status: Home / Self Care | Payer: Medicare HMO | Attending: Surgery | Admitting: Surgery

## 2021-10-16 ENCOUNTER — Encounter: Payer: Self-pay | Admitting: Surgery

## 2021-10-16 DIAGNOSIS — N400 Enlarged prostate without lower urinary tract symptoms: Secondary | ICD-10-CM | POA: Diagnosis not present

## 2021-10-16 DIAGNOSIS — Y9367 Activity, basketball: Secondary | ICD-10-CM | POA: Diagnosis not present

## 2021-10-16 DIAGNOSIS — G4733 Obstructive sleep apnea (adult) (pediatric): Secondary | ICD-10-CM | POA: Diagnosis not present

## 2021-10-16 DIAGNOSIS — M1711 Unilateral primary osteoarthritis, right knee: Secondary | ICD-10-CM | POA: Insufficient documentation

## 2021-10-16 DIAGNOSIS — Z8616 Personal history of COVID-19: Secondary | ICD-10-CM | POA: Insufficient documentation

## 2021-10-16 DIAGNOSIS — E7800 Pure hypercholesterolemia, unspecified: Secondary | ICD-10-CM | POA: Insufficient documentation

## 2021-10-16 DIAGNOSIS — M25761 Osteophyte, right knee: Secondary | ICD-10-CM | POA: Insufficient documentation

## 2021-10-16 DIAGNOSIS — Z96651 Presence of right artificial knee joint: Secondary | ICD-10-CM

## 2021-10-16 HISTORY — PX: PARTIAL KNEE ARTHROPLASTY: SHX2174

## 2021-10-16 LAB — ABO/RH: ABO/RH(D): A POS

## 2021-10-16 IMAGING — DX DG KNEE 1-2V PORT*R*
2 series · 2 of 2 positions shown · non-contrast
Comparison: MRI right knee [DATE]

CLINICAL DATA: Postoperative.

EXAM:
PORTABLE RIGHT KNEE - 1-2 VIEW

[knee ap]
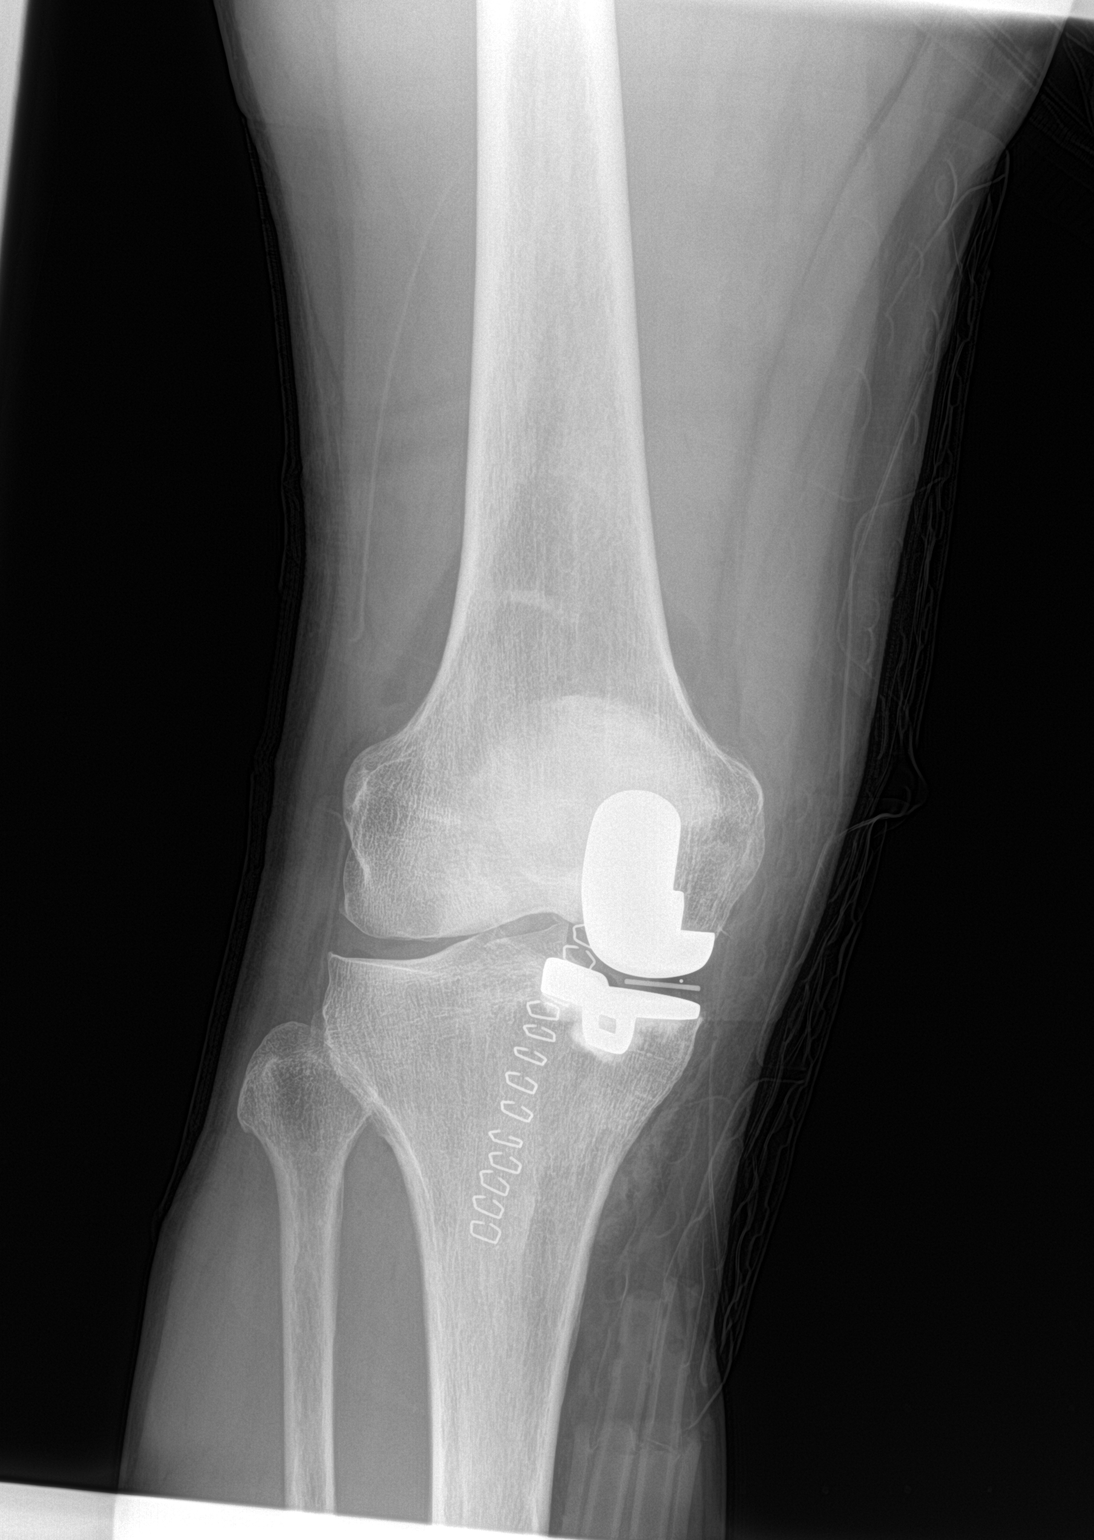

[knee lat]
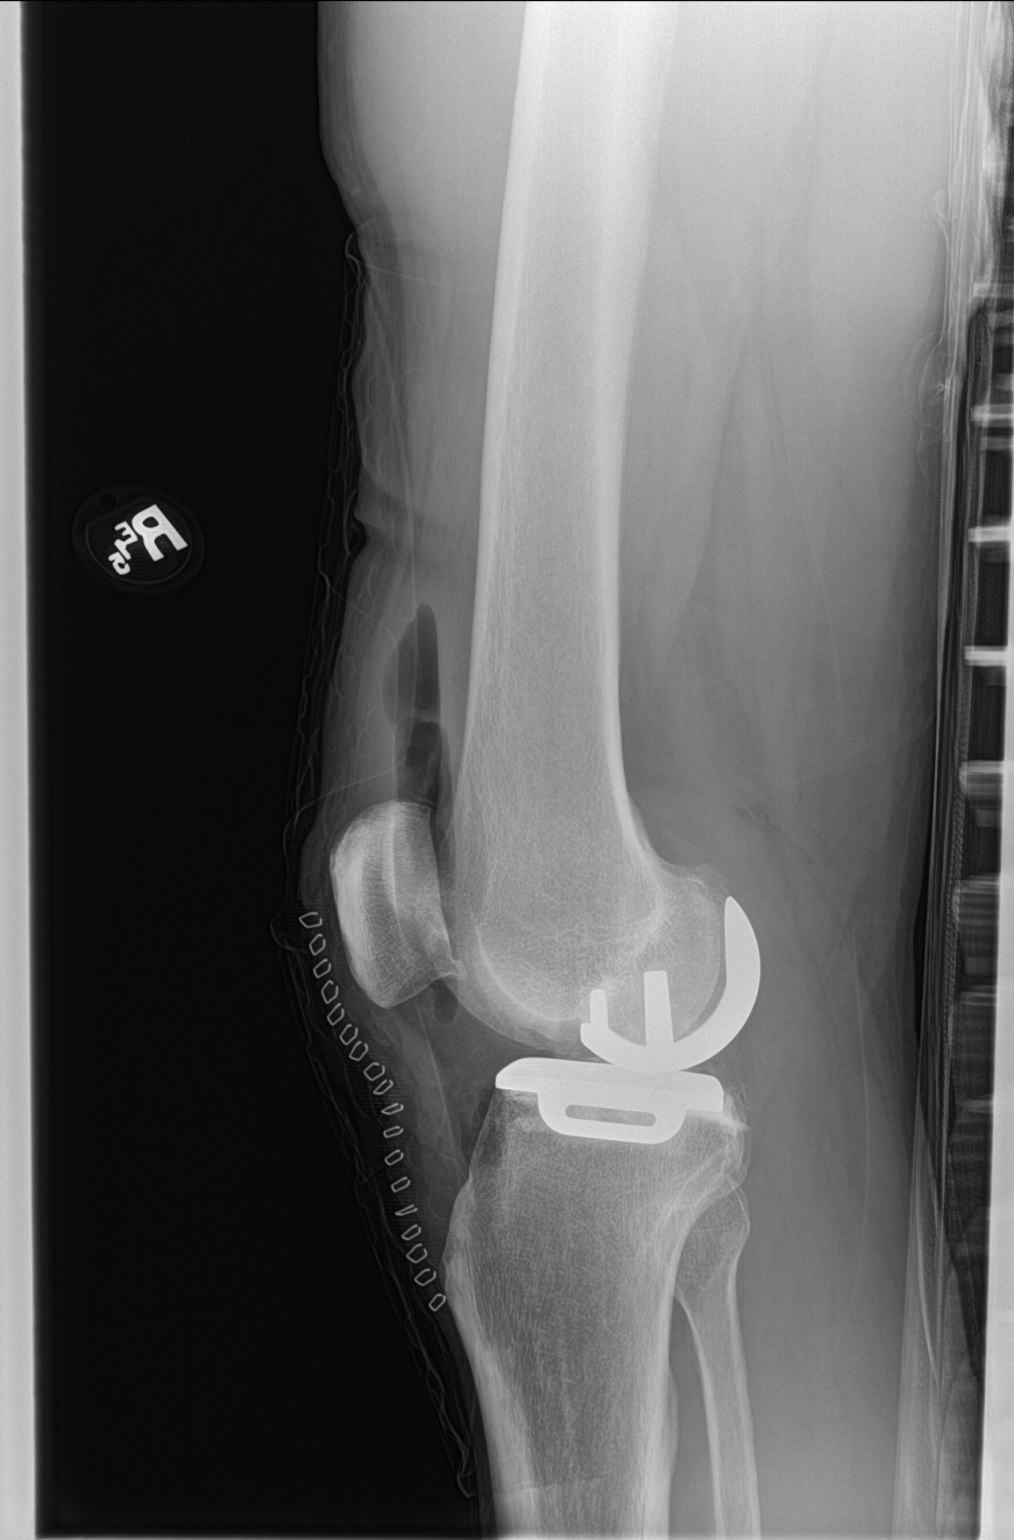

[2 of 2 positions shown; findings below may reference images not displayed]

FINDINGS: Interval medial unicompartmental arthroplasty. No perihardware
lucency to indicate hardware failure or loosening. Expected
postoperative changes including intra-articular air. Anterior
surgical skin staples.
IMPRESSION: Recent medial unicompartmental arthroplasty without evidence of
hardware failure.

## 2021-10-16 SURGERY — ARTHROPLASTY, KNEE, UNICOMPARTMENTAL
Anesthesia: Choice | Site: Knee | Laterality: Right

## 2021-10-16 MED ORDER — METOCLOPRAMIDE HCL 5 MG/ML IJ SOLN: 5.0000 mg | Freq: Three times a day (TID) | INTRAMUSCULAR | Status: DC | PRN

## 2021-10-16 MED ORDER — APREPITANT 40 MG PO CAPS
ORAL_CAPSULE | ORAL | Status: AC
  Administered 2021-10-16: 40 mg via ORAL
  Filled 2021-10-16: qty 1

## 2021-10-16 MED ORDER — FENTANYL CITRATE (PF) 100 MCG/2ML IJ SOLN: 25.0000 ug | INTRAMUSCULAR | Status: DC | PRN

## 2021-10-16 MED ORDER — TRANEXAMIC ACID 1000 MG/10ML IV SOLN
INTRAVENOUS | Status: DC | PRN
  Administered 2021-10-16: 1000 mg via TOPICAL

## 2021-10-16 MED ORDER — 0.9 % SODIUM CHLORIDE (POUR BTL) OPTIME
TOPICAL | Status: DC | PRN
  Administered 2021-10-16: 500 mL

## 2021-10-16 MED ORDER — TAMSULOSIN HCL 0.4 MG PO CAPS: 0.4000 mg | ORAL_CAPSULE | Freq: Every day | ORAL | Status: DC

## 2021-10-16 MED ORDER — PHENYLEPHRINE 40 MCG/ML (10ML) SYRINGE FOR IV PUSH (FOR BLOOD PRESSURE SUPPORT)
PREFILLED_SYRINGE | INTRAVENOUS | Status: DC | PRN
  Administered 2021-10-16 (×3): 80 ug via INTRAVENOUS

## 2021-10-16 MED ORDER — ACETAMINOPHEN 500 MG PO TABS
ORAL_TABLET | ORAL | Status: AC
  Administered 2021-10-16: 1000 mg via ORAL
  Filled 2021-10-16: qty 2

## 2021-10-16 MED ORDER — KETOROLAC TROMETHAMINE 15 MG/ML IJ SOLN: 7.5000 mg | Freq: Four times a day (QID) | INTRAMUSCULAR | Status: DC

## 2021-10-16 MED ORDER — FAMOTIDINE 20 MG PO TABS: 20.0000 mg | ORAL_TABLET | Freq: Once | ORAL | Status: AC

## 2021-10-16 MED ORDER — MIDAZOLAM HCL 2 MG/2ML IJ SOLN
INTRAMUSCULAR | Status: AC
  Filled 2021-10-16: qty 2

## 2021-10-16 MED ORDER — GLYCOPYRROLATE 0.2 MG/ML IJ SOLN
INTRAMUSCULAR | Status: DC | PRN
  Administered 2021-10-16: .2 mg via INTRAVENOUS

## 2021-10-16 MED ORDER — CHLORHEXIDINE GLUCONATE 0.12 % MT SOLN: 15.0000 mL | Freq: Once | OROMUCOSAL | Status: AC

## 2021-10-16 MED ORDER — BUPIVACAINE-EPINEPHRINE (PF) 0.5% -1:200000 IJ SOLN
INTRAMUSCULAR | Status: DC | PRN
Start: 2021-10-16 — End: 2021-10-16
  Administered 2021-10-16: 30 mL via PERINEURAL

## 2021-10-16 MED ORDER — SODIUM CHLORIDE 0.9 % BOLUS PEDS
250.0000 mL | Freq: Once | INTRAVENOUS | Status: DC
Start: 2021-10-16 — End: 2021-10-17

## 2021-10-16 MED ORDER — SODIUM CHLORIDE 0.9 % IV SOLN: INTRAVENOUS | Status: DC

## 2021-10-16 MED ORDER — ONDANSETRON HCL 4 MG/2ML IJ SOLN: 4.0000 mg | Freq: Four times a day (QID) | INTRAMUSCULAR | Status: DC | PRN

## 2021-10-16 MED ORDER — APREPITANT 40 MG PO CAPS: 40.0000 mg | ORAL_CAPSULE | Freq: Once | ORAL | Status: AC

## 2021-10-16 MED ORDER — BUPIVACAINE LIPOSOME 1.3 % IJ SUSP
INTRAMUSCULAR | Status: AC
  Filled 2021-10-16: qty 20

## 2021-10-16 MED ORDER — ONDANSETRON HCL 4 MG PO TABS: 4.0000 mg | ORAL_TABLET | Freq: Four times a day (QID) | ORAL | Status: DC | PRN

## 2021-10-16 MED ORDER — OXYCODONE HCL 5 MG/5ML PO SOLN: 5.0000 mg | Freq: Once | ORAL | Status: DC | PRN

## 2021-10-16 MED ORDER — BUPIVACAINE HCL (PF) 0.5 % IJ SOLN
INTRAMUSCULAR | Status: DC | PRN
  Administered 2021-10-16: 2.5 mL via INTRATHECAL

## 2021-10-16 MED ORDER — MIDAZOLAM HCL 5 MG/5ML IJ SOLN
INTRAMUSCULAR | Status: DC | PRN
  Administered 2021-10-16: 2 mg via INTRAVENOUS

## 2021-10-16 MED ORDER — SODIUM CHLORIDE 0.9 % BOLUS PEDS
250.0000 mL | Freq: Once | INTRAVENOUS | Status: AC
  Administered 2021-10-16: 250 mL via INTRAVENOUS

## 2021-10-16 MED ORDER — DEXAMETHASONE SODIUM PHOSPHATE 10 MG/ML IJ SOLN
INTRAMUSCULAR | Status: DC | PRN
  Administered 2021-10-16: 10 mg via INTRAVENOUS

## 2021-10-16 MED ORDER — OXYCODONE HCL 5 MG PO TABS: 5.0000 mg | ORAL_TABLET | Freq: Once | ORAL | Status: DC | PRN

## 2021-10-16 MED ORDER — FAMOTIDINE 20 MG PO TABS
ORAL_TABLET | ORAL | Status: AC
  Administered 2021-10-16: 20 mg via ORAL
  Filled 2021-10-16: qty 1

## 2021-10-16 MED ORDER — FENTANYL CITRATE (PF) 100 MCG/2ML IJ SOLN
INTRAMUSCULAR | Status: DC | PRN
  Administered 2021-10-16: 100 ug via INTRAVENOUS

## 2021-10-16 MED ORDER — ONDANSETRON HCL 4 MG/2ML IJ SOLN
INTRAMUSCULAR | Status: DC | PRN
  Administered 2021-10-16: 4 mg via INTRAVENOUS

## 2021-10-16 MED ORDER — CEFAZOLIN SODIUM-DEXTROSE 2-4 GM/100ML-% IV SOLN: 2.0000 g | Freq: Four times a day (QID) | INTRAVENOUS | Status: DC

## 2021-10-16 MED ORDER — PROPOFOL 500 MG/50ML IV EMUL
INTRAVENOUS | Status: DC | PRN
  Administered 2021-10-16: 145 ug/kg/min via INTRAVENOUS

## 2021-10-16 MED ORDER — PROPOFOL 1000 MG/100ML IV EMUL
INTRAVENOUS | Status: AC
  Filled 2021-10-16: qty 100

## 2021-10-16 MED ORDER — EPHEDRINE SULFATE (PRESSORS) 50 MG/ML IJ SOLN
INTRAMUSCULAR | Status: DC | PRN
  Administered 2021-10-16 (×4): 5 mg via INTRAVENOUS

## 2021-10-16 MED ORDER — TRANEXAMIC ACID 1000 MG/10ML IV SOLN
INTRAVENOUS | Status: AC
  Filled 2021-10-16: qty 10

## 2021-10-16 MED ORDER — CEFAZOLIN SODIUM-DEXTROSE 2-4 GM/100ML-% IV SOLN
INTRAVENOUS | Status: AC
  Filled 2021-10-16: qty 100

## 2021-10-16 MED ORDER — ORAL CARE MOUTH RINSE: 15.0000 mL | Freq: Once | OROMUCOSAL | Status: AC

## 2021-10-16 MED ORDER — SODIUM CHLORIDE 0.9 % IR SOLN
Status: DC | PRN
  Administered 2021-10-16: 3000 mL

## 2021-10-16 MED ORDER — BUPIVACAINE-EPINEPHRINE (PF) 0.5% -1:200000 IJ SOLN
INTRAMUSCULAR | Status: AC
  Filled 2021-10-16: qty 30

## 2021-10-16 MED ORDER — CEFAZOLIN SODIUM-DEXTROSE 2-4 GM/100ML-% IV SOLN
INTRAVENOUS | Status: AC
  Administered 2021-10-16: 2 g via INTRAVENOUS
  Filled 2021-10-16: qty 100

## 2021-10-16 MED ORDER — ACETAMINOPHEN 500 MG PO TABS
ORAL_TABLET | ORAL | Status: AC
  Filled 2021-10-16: qty 2

## 2021-10-16 MED ORDER — PHENYLEPHRINE HCL-NACL 20-0.9 MG/250ML-% IV SOLN
INTRAVENOUS | Status: DC | PRN
  Administered 2021-10-16: 10 ug/min via INTRAVENOUS

## 2021-10-16 MED ORDER — FENTANYL CITRATE (PF) 100 MCG/2ML IJ SOLN
INTRAMUSCULAR | Status: AC
  Filled 2021-10-16: qty 2

## 2021-10-16 MED ORDER — CEFAZOLIN SODIUM-DEXTROSE 2-4 GM/100ML-% IV SOLN
2.0000 g | INTRAVENOUS | Status: AC
  Administered 2021-10-16: 2 g via INTRAVENOUS

## 2021-10-16 MED ORDER — PROPOFOL 10 MG/ML IV BOLUS
INTRAVENOUS | Status: AC
  Filled 2021-10-16: qty 80

## 2021-10-16 MED ORDER — OXYCODONE HCL 5 MG PO TABS: 5.0000 mg | ORAL_TABLET | ORAL | Status: DC | PRN

## 2021-10-16 MED ORDER — LACTATED RINGERS IV SOLN: INTRAVENOUS | Status: DC

## 2021-10-16 MED ORDER — CHLORHEXIDINE GLUCONATE 0.12 % MT SOLN
OROMUCOSAL | Status: AC
  Administered 2021-10-16: 15 mL via OROMUCOSAL
  Filled 2021-10-16: qty 15

## 2021-10-16 MED ORDER — METOCLOPRAMIDE HCL 10 MG PO TABS: 5.0000 mg | ORAL_TABLET | Freq: Three times a day (TID) | ORAL | Status: DC | PRN

## 2021-10-16 MED ORDER — OXYCODONE HCL 5 MG PO TABS: 5.0000 mg | ORAL_TABLET | ORAL | 0 refills | Status: AC | PRN

## 2021-10-16 MED ORDER — DEXMEDETOMIDINE (PRECEDEX) IN NS 20 MCG/5ML (4 MCG/ML) IV SYRINGE
PREFILLED_SYRINGE | INTRAVENOUS | Status: DC | PRN
  Administered 2021-10-16: 4 ug via INTRAVENOUS

## 2021-10-16 MED ORDER — APIXABAN 2.5 MG PO TABS: 2.5000 mg | ORAL_TABLET | Freq: Two times a day (BID) | ORAL | 0 refills | Status: AC

## 2021-10-16 MED ORDER — ACETAMINOPHEN 10 MG/ML IV SOLN: 1000.0000 mg | Freq: Once | INTRAVENOUS | Status: DC | PRN

## 2021-10-16 MED ORDER — ACETAMINOPHEN 500 MG PO TABS
1000.0000 mg | ORAL_TABLET | Freq: Four times a day (QID) | ORAL | Status: DC
  Administered 2021-10-16: 1000 mg via ORAL

## 2021-10-16 MED ORDER — KETOROLAC TROMETHAMINE 15 MG/ML IJ SOLN
INTRAMUSCULAR | Status: AC
  Administered 2021-10-16: 15 mg via INTRAVENOUS
  Filled 2021-10-16: qty 1

## 2021-10-16 MED ORDER — KETOROLAC TROMETHAMINE 15 MG/ML IJ SOLN
15.0000 mg | Freq: Once | INTRAMUSCULAR | Status: AC
Start: 2021-10-16 — End: 2021-10-16

## 2021-10-16 MED ORDER — SODIUM CHLORIDE FLUSH 0.9 % IV SOLN
INTRAVENOUS | Status: AC
  Filled 2021-10-16: qty 40

## 2021-10-16 MED ORDER — ACETAMINOPHEN 500 MG PO TABS
1000.0000 mg | ORAL_TABLET | Freq: Once | ORAL | Status: AC
Start: 2021-10-16 — End: 2021-10-16

## 2021-10-16 MED ORDER — BUPIVACAINE LIPOSOME 1.3 % IJ SUSP
INTRAMUSCULAR | Status: DC | PRN
  Administered 2021-10-16: 20 mL

## 2021-10-16 MED ORDER — DROPERIDOL 2.5 MG/ML IJ SOLN
0.6250 mg | Freq: Once | INTRAMUSCULAR | Status: DC | PRN
  Filled 2021-10-16: qty 2

## 2021-10-16 SURGICAL SUPPLY — 66 items
BEARING MENISCAL TIBIAL 3 MD R (Orthopedic Implant) ×1 IMPLANT
BIT DRILL QUICK REL 1/8 2PK SL (DRILL) IMPLANT
BNDG ELASTIC 6X5.8 VLCR STR LF (GAUZE/BANDAGES/DRESSINGS) ×2 IMPLANT
BOWL CEMENT MIXING ADV NOZZLE (MISCELLANEOUS) ×1 IMPLANT
CEMENT BONE R 1X40 (Cement) ×2 IMPLANT
CEMENT VACUUM MIXING SYSTEM (MISCELLANEOUS) ×1 IMPLANT
CHLORAPREP W/TINT 26 (MISCELLANEOUS) ×4 IMPLANT
COOLER POLAR GLACIER W/PUMP (MISCELLANEOUS) ×2 IMPLANT
COVER MAYO STAND REUSABLE (DRAPES) ×2 IMPLANT
CUFF TOURN SGL QUICK 24 (TOURNIQUET CUFF)
CUFF TOURN SGL QUICK 34 (TOURNIQUET CUFF)
CUFF TRNQT CYL 24X4X16.5-23 (TOURNIQUET CUFF) IMPLANT
CUFF TRNQT CYL 34X4.125X (TOURNIQUET CUFF) IMPLANT
DRAPE C-ARM XRAY 36X54 (DRAPES) IMPLANT
DRAPE U-SHAPE 47X51 STRL (DRAPES) ×4 IMPLANT
DRILL QUICK RELEASE 1/8 INCH (DRILL) ×1
DRSG MEPILEX SACRM 8.7X9.8 (GAUZE/BANDAGES/DRESSINGS) ×1 IMPLANT
DRSG OPSITE POSTOP 4X12 (GAUZE/BANDAGES/DRESSINGS) ×2 IMPLANT
DRSG OPSITE POSTOP 4X6 (GAUZE/BANDAGES/DRESSINGS) ×2 IMPLANT
ELECT CAUTERY BLADE 6.4 (BLADE) ×2 IMPLANT
ELECT REM PT RETURN 9FT ADLT (ELECTROSURGICAL) ×2
ELECTRODE REM PT RTRN 9FT ADLT (ELECTROSURGICAL) ×1 IMPLANT
GAUZE SPONGE 4X4 12PLY STRL (GAUZE/BANDAGES/DRESSINGS) ×2 IMPLANT
GAUZE XEROFORM 1X8 LF (GAUZE/BANDAGES/DRESSINGS) ×2 IMPLANT
GLOVE SRG 8 PF TXTR STRL LF DI (GLOVE) ×1 IMPLANT
GLOVE SURG ENC MOIS LTX SZ7.5 (GLOVE) ×8 IMPLANT
GLOVE SURG ENC MOIS LTX SZ8 (GLOVE) ×8 IMPLANT
GLOVE SURG UNDER LTX SZ8 (GLOVE) ×2 IMPLANT
GLOVE SURG UNDER POLY LF SZ8 (GLOVE) ×1
GOWN STRL REUS W/ TWL LRG LVL3 (GOWN DISPOSABLE) ×1 IMPLANT
GOWN STRL REUS W/ TWL XL LVL3 (GOWN DISPOSABLE) ×1 IMPLANT
GOWN STRL REUS W/TWL LRG LVL3 (GOWN DISPOSABLE) ×1
GOWN STRL REUS W/TWL XL LVL3 (GOWN DISPOSABLE) ×1
IV NS IRRIG 3000ML ARTHROMATIC (IV SOLUTION) ×2 IMPLANT
KIT TURNOVER KIT A (KITS) ×2 IMPLANT
MANIFOLD NEPTUNE II (INSTRUMENTS) ×2 IMPLANT
MAT ABSORB  FLUID 56X50 GRAY (MISCELLANEOUS) ×1
MAT ABSORB FLUID 56X50 GRAY (MISCELLANEOUS) ×1 IMPLANT
NDL HPO THNWL 1X22GA REG BVL (NEEDLE) IMPLANT
NDL SAFETY ECLIPSE 18X1.5 (NEEDLE) ×1 IMPLANT
NDL SPNL 20GX3.5 QUINCKE YW (NEEDLE) ×1 IMPLANT
NEEDLE HYPO 18GX1.5 SHARP (NEEDLE) ×1
NEEDLE SAFETY 22GX1 (NEEDLE) ×1
NEEDLE SPNL 20GX3.5 QUINCKE YW (NEEDLE) ×2 IMPLANT
NS IRRIG 1000ML POUR BTL (IV SOLUTION) ×2 IMPLANT
PACK BLADE SAW RECIP 70 3 PT (BLADE) ×1 IMPLANT
PACK TOTAL KNEE (MISCELLANEOUS) ×2 IMPLANT
PAD ABD DERMACEA PRESS 5X9 (GAUZE/BANDAGES/DRESSINGS) ×6 IMPLANT
PAD WRAPON POLAR KNEE (MISCELLANEOUS) ×1 IMPLANT
PEG TWIN FEM CEMENTED MED (Knees) ×1 IMPLANT
PENCIL ELECTRO HAND CTR (MISCELLANEOUS) ×1 IMPLANT
PULSAVAC PLUS IRRIG FAN TIP (DISPOSABLE) ×2
STAPLER SKIN PROX 35W (STAPLE) ×2 IMPLANT
STRAP SAFETY 5IN WIDE (MISCELLANEOUS) ×2 IMPLANT
SUCTION FRAZIER HANDLE 10FR (MISCELLANEOUS) ×1
SUCTION TUBE FRAZIER 10FR DISP (MISCELLANEOUS) ×1 IMPLANT
SUT VIC AB 0 CT1 36 (SUTURE) ×2 IMPLANT
SUT VIC AB 2-0 CT1 27 (SUTURE) ×4
SUT VIC AB 2-0 CT1 TAPERPNT 27 (SUTURE) ×4 IMPLANT
SYR 10ML LL (SYRINGE) ×2 IMPLANT
SYR 30ML LL (SYRINGE) ×4 IMPLANT
TAPE TRANSPORE STRL 2 31045 (GAUZE/BANDAGES/DRESSINGS) ×2 IMPLANT
TIP FAN IRRIG PULSAVAC PLUS (DISPOSABLE) ×1 IMPLANT
TRAY TIBIAL OXFORD SZ C RT (Joint) ×1 IMPLANT
WATER STERILE IRR 500ML POUR (IV SOLUTION) ×2 IMPLANT
WRAPON POLAR PAD KNEE (MISCELLANEOUS) ×2

## 2021-10-16 NOTE — Transfer of Care (Signed)
Immediate Anesthesia Transfer of Care Note ? ?Patient: Mark Davenport ? ?Procedure(s) Performed: Right partial knee replacement (Right: Knee) ? ?Patient Location: PACU ? ?Anesthesia Type:General ? ?Level of Consciousness: awake, drowsy and patient cooperative ? ?Airway & Oxygen Therapy: Patient Spontanous Breathing and Patient connected to face mask oxygen ? ?Post-op Assessment: Report given to RN and Post -op Vital signs reviewed and stable ? ?Post vital signs: Reviewed and stable ? ?Last Vitals:  ?Vitals Value Taken Time  ?BP 91/69 10/16/21 1215  ?Temp 36.2 ?C 10/16/21 1214  ?Pulse 73 10/16/21 1215  ?Resp 11 10/16/21 1215  ?SpO2 99 % 10/16/21 1215  ?Vitals shown include unvalidated device data. ? ?Last Pain:  ?Vitals:  ? 10/16/21 0817  ?TempSrc: Temporal  ?PainSc: 0-No pain  ?   ? ?  ? ?Complications: No notable events documented. ?

## 2021-10-16 NOTE — Anesthesia Procedure Notes (Signed)
Procedure Name: General with mask airway ?Date/Time: 10/16/2021 10:11 AM ?Performed by: Kelton Pillar, CRNA ?Pre-anesthesia Checklist: Patient identified, Emergency Drugs available, Suction available and Patient being monitored ?Patient Re-evaluated:Patient Re-evaluated prior to induction ?Oxygen Delivery Method: Simple face mask ?Induction Type: IV induction ?Placement Confirmation: CO2 detector and positive ETCO2 ?Dental Injury: Teeth and Oropharynx as per pre-operative assessment  ? ? ? ? ?

## 2021-10-16 NOTE — Progress Notes (Signed)
Voided another . Bladder scanned for 349 mls. No c/o abdominal discomfort ?

## 2021-10-16 NOTE — Op Note (Signed)
10/16/2021 ? ?12:17 PM ? ?Patient:   Mark Davenport ? ?Pre-Op Diagnosis:   Osteoarthritis of medial compartment, right knee. ? ?Post-Op Diagnosis:   Same ? ?Procedure:   Right unicondylar knee arthroplasty. ? ?Surgeon:   Maryagnes Amos, MD ? ?Assistant:   Haynes Hoehn, RNFA ? ?Anesthesia:   Spinal ? ?Findings:   As above. ? ?Complications:   None ? ?EBL:   10 cc ? ?Fluids:   800 cc crystalloid ? ?UOP:   None ? ?TT:   90 minutes at 300 mmHg ? ?Drains:   None ? ?Closure:   Staples ? ?Implants:   All-cemented Biomet Oxford system with a medium femoral component, a "C" sized tibial tray, and a 3 mm meniscal bearing insert. ? ?Brief Clinical Note:   The patient is a 71 year old male with a history of progressively worsening medial sided right knee pain. His symptoms have progressed despite medications, activity modification, etc. His history and examination are consistent with degenerative joint disease with degenerative medial meniscus tear, all of which were confirmed by preoperative MRI scanning. His lateral and patellofemoral compartments appear to be well-maintained. The patient presents at this time for a right partial knee replacement. ? ?Procedure:   The patient was brought into the operating room and a spinal placed by the anesthesiologist. The patient was then repositioned so that the non-surgical leg was placed in a flexed and abducted position in the yellow fin leg holder while the surgical extremity was placed over the Biomet leg holder. The right lower extremity was prepped with ChloraPrep solution before being draped sterilely. Preoperative antibiotics were administered. After performing a timeout to verify the appropriate surgical site, the limb was exsanguinated with an Esmarch and the tourniquet inflated to 300 mmHg.  ? ?A standard anterior approach to the knee was made through an approximately 3.5-4 inch incision. The incision was carried down through the subcutaneous tissues to expose the superficial  retinaculum. This was split the length the incision and the medial flap elevated sufficiently to expose the medial retinaculum. The medial retinaculum was incised along the medial border of the patella tendon and extended proximally along the medial border of the patella, leaving a 3-4 mm cuff of tissue. The soft tissues were elevated off the anteromedial aspect of the proximal tibia. The anterior portion of the meniscus was removed after performing a subtotal excision of the infrapatellar fat pad. The anterior cruciate ligament was inspected and found to be in excellent condition. Osteophytes were removed from the inferior pole of the patella as well as from the notch using a quarter-inch osteotome. There were significant degenerative changes of both the femur and tibia on the medial side.  ? ?The medial femoral condyle was sized using the small and medium sizers. It was felt that the medium guide best optimized the contour of the femur. This was left in place and the external tibial guide positioned. The coupling device was used to connect the guide to the medial femoral condylar sizer to optimize appropriate orientation. Two guide pins were inserted into the cutting block before the coupling device and sizer were removed. The appropriate tibial cut was made using the oscillating and reciprocating saws. The piece was removed in its entirety and taken to the back table where it was sized and found to be optimally replicated by a "C" sized component. The 8 mm spacer was inserted to verify that sufficient bone had been removed. ? ?Attention was directed to femoral side. The intramedullary canal was accessed  through a 4 mm drill hole. The intramedullary guide was positioned before the guide for the femoral condylar holes was positioned. The appropriate coupling device connected this guide to the intramedullary guide before both drill holes were created in the distal aspect of the medial femoral condyle. The devices  were removed and the posterior condylar cutting block inserted. The appropriate cut was made using the reciprocating saw and this piece removed. The #0 spigot was inserted and the initial bone milling performed. A trial femoral component was inserted and both the flexion and extension gaps measured. In flexion, the gap measured 7 mm whereas in extension, it measured 4 mm. Therefore, the #3 spigot was selected and the secondary bone milling performed. Repeat sizing demonstrated symmetric flexion and extension gaps. The bone was removed from the postero-medial and postero-lateral aspects of the femoral condyle, as well as from the beneath the collar of the spigot. Bone also was removed from the anterior portion of the femur so as to minimize any potential impingement with the meniscal bearing insert. The trial components removed and several drill holes placed into the distal femoral condyle to further augment cement fixation. ? ?Attention was redirected to the tibial side. The "C" sized tibial tray was positioned and temporarily secured using the appropriate spiked nail. The keel was created using the bi-bladed reciprocating saw and hoe. The keeled "C" sized trial tibial tray was inserted to be sure that it seated properly. At this point, a total of 20 cc of Exparel diluted out to 60 cc with normal saline and 30 cc of 0.5% Sensorcaine was injected in and around the posterior and medial capsular tissues, as well as the peri-incisional tissues to help with postoperative pain control. ? ?The bony surfaces were prepared for cementing by irrigating them thoroughly with bacitracin saline solution using the jet lavage system before packing them with a dry Ray-Tec sponge. Meanwhile, cement was being mixed on the back table. When the cement was ready, the tibial tray was cemented in first. The excess cement was removed using a Public house manager after impacting it into place. Next, the femoral component was impacted into place.  Again the excess cement was removed using a Public house manager. The 4 mm spacer was inserted and the knee brought into near full extension while the cement hardened. Once the cement hardened, the spacer was removed and the 3 mm meniscal bearing insert was trialed. This demonstrated excellent tracking while the knee was placed through a range of motion, and showed no evidence towards subluxation or dislocation. In addition, it did not fit too tightly. Therefore, the permanent 3 mm meniscal bearing insert was snapped into position after verifying that no cement had been retained posteriorly. Again the knee was placed through a range of motion with the findings as described above. ? ?The wound was copiously irrigated with sterile saline solution via the jet lavage system before the retinacular layer was reapproximated using #0 Vicryl interrupted sutures. At this point, 1 g of transexemic acid in 10 cc of normal saline was injected intra-articularly. The subcutaneous tissues were closed in two layers using 2-0 Vicryl interrupted sutures before the skin was closed using staples. A sterile occlusive dressing was applied to the knee before the patient was awakened. The patient was transferred back to his/her hospital bed and returned to the recovery room in satisfactory condition after tolerating the procedure well. A Polar Care device was applied to the knee as well. ?

## 2021-10-16 NOTE — Anesthesia Procedure Notes (Signed)
Spinal ? ?Patient location during procedure: OR ?Start time: 10/16/2021 9:50 AM ?End time: 10/16/2021 9:50 AM ?Reason for block: surgical anesthesia ?Staffing ?Performed: anesthesiologist  ?Anesthesiologist: Foye Deer, MD ?Preanesthetic Checklist ?Completed: patient identified, IV checked, site marked, risks and benefits discussed, surgical consent, monitors and equipment checked, pre-op evaluation and timeout performed ?Spinal Block ?Patient position: sitting ?Prep: DuraPrep ?Patient monitoring: heart rate, cardiac monitor, continuous pulse ox and blood pressure ?Approach: midline ?Location: L3-4 ?Injection technique: single-shot ?Needle ?Needle type: Sprotte  ?Needle gauge: 24 G ?Needle length: 9 cm ?Assessment ?Sensory level: T10 ?Events: CSF return ? ? ? ?

## 2021-10-16 NOTE — Progress Notes (Signed)
PT in with patient. Able to ambulate. Spinal completely resolved. Voided another 100 mls urine. Bladder scanned for 474 mls.  ?

## 2021-10-16 NOTE — Discharge Instructions (Addendum)
Orthopedic discharge instructions: ?May shower with intact OpSite dressing. ?Apply ice frequently to knee or use Polar Care. ?Start Eliquis 2.5 mg twice daily for 2 weeks on Thursday, 10/17/2021, then take aspirin 325 mg twice daily for 4 weeks. ?Take oxycodone as prescribed when needed.  ?May supplement with ES Tylenol if necessary. ?May weight-bear as tolerated - use walker or crutches for balance and support. ?Follow-up in 10-14 days or as scheduled. ? ?AMBULATORY SURGERY  ?DISCHARGE INSTRUCTIONS ? ? ?The drugs that you were given will stay in your system until tomorrow so for the next 24 hours you should not: ? ?Drive an automobile ?Make any legal decisions ?Drink any alcoholic beverage ? ? ?You may resume regular meals tomorrow.  Today it is better to start with liquids and gradually work up to solid foods. ? ?You may eat anything you prefer, but it is better to start with liquids, then soup and crackers, and gradually work up to solid foods. ? ? ?Please notify your doctor immediately if you have any unusual bleeding, trouble breathing, redness and pain at the surgery site, drainage, fever, or pain not relieved by medication. ? ? ? ?Additional Instructions: ? ? ?Please contact your physician with any problems or Same Day Surgery at [phone], Monday through Friday 6 am to 4 pm, or [place] [facility] number at [phone].  ?

## 2021-10-16 NOTE — Progress Notes (Signed)
Voided another . Paged Dr Joice Lofts. Okay to be discharged home. Bladder nondistended, still voiding without difficulty. Voices readiness to be discharged to home. Informed Mr Heber to increase his Flomax to twice daily for the next 2 days. No c/o pain.  ?

## 2021-10-16 NOTE — Evaluation (Signed)
Physical Therapy Evaluation ?Patient Details ?Name: Mark Davenport ?MRN: [id number] ?DOB: [birth year] ?Today's Date: 10/16/2021 ? ?History of Present Illness ? Pt is a 71 yo M diagnosed with osteoarthritis of medial compartment of the right knee and is s/p elective R unicondylar knee arthroplasty. PMH includes: OA, BPH, vertigo, L ankle Fx, and R shoulder Sx. ?  ?Clinical Impression ? Pt was pleasant and motivated to participate during the session and put forth good effort throughout. Pt required no physical assistance with any functional task and presented with good control and stability throughout.  Pt was able to amb 100 feet with very little perceived exertion with SpO2 and HR WNL.  Pt was steady during stair training only requiring cuing for proper sequencing but no physical assist and with no signs of R knee instability or buckling.  Pt reported no adverse symptoms during the session including no R knee pain.  Pt will benefit from HHPT upon discharge to safely address deficits listed in patient problem list for decreased caregiver assistance and eventual return to PLOF. ?  ?     ?   ? ?Recommendations for follow up therapy are one component of a multi-disciplinary discharge planning process, led by the attending physician.  Recommendations may be updated based on patient status, additional functional criteria and insurance authorization. ? ?Follow Up Recommendations Home health PT ? ?  ?Assistance Recommended at Discharge Intermittent Supervision/Assistance  ?Patient can return home with the following ? A little help with walking and/or transfers;A little help with bathing/dressing/bathroom;Help with stairs or ramp for entrance;Assist for transportation;Assistance with cooking/housework ? ?  ?Equipment Recommendations Rolling walker (2 wheels)  ?Recommendations for Other Services ?    ?  ?Functional Status Assessment Patient has had a recent decline in their functional status and demonstrates the ability to make  significant improvements in function in a reasonable and predictable amount of time.  ? ?  ?Precautions / Restrictions Precautions ?Precautions: Knee ?Restrictions ?Weight Bearing Restrictions: Yes ?RLE Weight Bearing: Weight bearing as tolerated  ? ?  ? ?Mobility ? Bed Mobility ?  ?  ?  ?  ?  ?  ?  ?General bed mobility comments: NT, pt found sitting at EOB ?  ? ?Transfers ?Overall transfer level: Needs assistance ?Equipment used: Rolling walker (2 wheels) ?Transfers: Sit to/from Stand ?Sit to Stand: Supervision ?  ?  ?  ?  ?  ?General transfer comment: Very good eccentric and concentric control and stability with min cues for hand placement ?  ? ?Ambulation/Gait ?Ambulation/Gait assistance: Supervision ?Gait Distance (Feet): 100 Feet ?Assistive device: Rolling walker (2 wheels) ?Gait Pattern/deviations: Step-through pattern, Decreased step length - right, Decreased step length - left ?Gait velocity: min decreased ?  ?  ?General Gait Details: Min cues for general sequencing with the RW with pt steady with no LOB or buckling ? ?Stairs ?Stairs: Yes ?Stairs assistance: Min guard ?Stair Management: No rails, Backwards, With walker ?Number of Stairs: 4 ?General stair comments: Pt able to ascend backwards and descend forwards 4 steps x 2 with a RW and mod verbal and visual cues for sequencing; pt steady with no LOB or buckling ? ?Wheelchair Mobility ?  ? ?Modified Rankin (Stroke Patients Only) ?  ? ?  ? ?Balance Overall balance assessment: Needs assistance ?  ?Sitting balance-Leahy Scale: Normal ?  ?  ?Standing balance support: Bilateral upper extremity supported, During functional activity ?Standing balance-Leahy Scale: Good ?Standing balance comment: minimal lean on the RW for support ?  ?  ?  ?  ?  ?  ?  ?  ?  ?  ?  ?   ? ? ? ?  Pertinent Vitals/Pain Pain Assessment ?Pain Assessment: No/denies pain  ? ? ?Home Living Family/patient expects to be discharged to:: Private residence ?Living Arrangements: Spouse/significant  other ?Available Help at Discharge: Family;Available 24 hours/day ?Type of Home: House ?Home Access: Stairs to enter ?Entrance Stairs-Rails: None ?Entrance Stairs-Number of Steps: 2 ?  ?Home Layout: Two level;Able to live on main level with bedroom/bathroom ?Home Equipment: Shower seat;BSC/3in1 ?   ?  ?Prior Function Prior Level of Function : Independent/Modified Independent ?  ?  ?  ?  ?  ?  ?Mobility Comments: Ind with amb community distances without an AD, no fall history, goes to the [facility] 4x/wk for stregth training and cardio ?ADLs Comments: Ind with ADLs ?  ? ? ?Hand Dominance  ?   ? ?  ?Extremity/Trunk Assessment  ? Upper Extremity Assessment ?Upper Extremity Assessment: Overall WFL for tasks assessed ?  ? ?Lower Extremity Assessment ?Lower Extremity Assessment: Generalized weakness;RLE deficits/detail ?RLE Deficits / Details: BLE ankle strength and sensation WNL, pt able to perform Ind RLE SLR without extensor lag ?RLE Sensation: WNL ?RLE Coordination: WNL ?  ? ?   ?Communication  ? Communication: No difficulties  ?Cognition Arousal/Alertness: Awake/alert ?Behavior During Therapy: [facility] for tasks assessed/performed ?Overall Cognitive Status: Within Functional Limits for tasks assessed ?  ?  ?  ?  ?  ?  ?  ?  ?  ?  ?  ?  ?  ?  ?  ?  ?  ?  ?  ? ?  ?General Comments   ? ?  ?Exercises Total Joint Exercises ?Quad Sets: Right, AROM, Strengthening, 5 reps, 10 reps ?Straight Leg Raises: AROM, Strengthening, Right, 5 reps ?Long Arc Quad: AROM, Strengthening, Right, 10 reps, 15 reps ?Knee Flexion: AROM, Strengthening, Right, 10 reps, 15 reps ?Goniometric ROM: R knee AROM: 5-100 deg ?Marching in Standing: AROM, Strengthening, Both, 5 reps, Standing ?Other Exercises ?Other Exercises: HEP education per handout ?Other Exercises: Positioning education to promote R knee ext PROM and prevent heel pressure ?Other Exercises: Car transfer sequencing education provided ?Other Exercises: 90 deg R turn training to prevent CKC  twisting on the R knee  ? ?Assessment/Plan  ?  ?PT Assessment Patient needs continued PT services  ?PT Problem List Decreased strength;Decreased range of motion;Decreased activity tolerance;Decreased mobility;Decreased knowledge of use of DME ? ?   ?  ?PT Treatment Interventions DME instruction;Gait training;Stair training;Functional mobility training;Therapeutic activities;Therapeutic exercise;Balance training;Patient/family education   ? ?PT Goals (Current goals can be found in the Care Plan section)  ?Acute Rehab PT Goals ?Patient Stated Goal: To walk without pain ?PT Goal Formulation: With patient ?Time For Goal Achievement: 10/29/21 ?Potential to Achieve Goals: Good ? ?  ?Frequency BID ?  ? ? ?Co-evaluation   ?  ?  ?  ?  ? ? ?  ?AM-PAC PT "6 Clicks" Mobility  ?Outcome Measure Help needed turning from your back to your side while in a flat bed without using bedrails?: A Little ?Help needed moving from lying on your back to sitting on the side of a flat bed without using bedrails?: A Little ?Help needed moving to and from a bed to a chair (including a wheelchair)?: A Little ?Help needed standing up from a chair using your arms (e.g., wheelchair or bedside chair)?: A Little ?Help needed to walk in hospital room?: A Little ?Help needed climbing 3-5 steps with a railing? : A Little ?6 Click Score: 18 ? ?  ?End of Session Equipment Utilized During Treatment: Gait  belt ?Activity Tolerance: Patient tolerated treatment well ?Patient left: in chair;with nursing/sitter in room ?Nurse Communication: Mobility status ?PT Visit Diagnosis: Other abnormalities of gait and mobility (R26.89);Muscle weakness (generalized) (M62.81) ?  ? ?Time: 8786-7672 ?PT Time Calculation (min) (ACUTE ONLY): 50 min ? ? ?Charges:   PT Evaluation ?$PT Eval Moderate Complexity: 1 Mod ?PT Treatments ?$Gait Training: 8-22 mins ?$Therapeutic Activity: 8-22 mins ?  ?   ? ? ?D. Elly Modena PT, DPT ?10/16/21, 5:33 PM ? ? ?
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
## 2021-10-16 NOTE — TOC Initial Note (Signed)
Transition of Care (TOC) - Initial/Assessment Note  ? ? ?Patient Details  ?Name: Mark Davenport ?MRN: [id number] ?Date of Birth: 04/05/1951 ? ?Transition of Care (TOC) CM/SW Contact:    ?Hoxie Cellar, RN ?Phone Number: ?10/16/2021, 4:18 PM ? ?Clinical Narrative:                 ?Spoke with Marylu Lund, spouse who reports patient has all needed equipment at home including walker, shower chair and bedside commode. Reports no needs at this time. Wife is waiting to transport patient home and will be assisting as needed. Patient is already set up with [facility] for home health PT. No needs or concerns at this time and wife confirmed she is waiting on phone call letting her know patient is ready for discharge.  ? ?Updated RN.  ? ?  ?  ? ? ?Patient Goals and CMS Choice ?  ?  ?  ? ?Expected Discharge Plan and Services ?  ?  ?  ?  ?  ?Expected Discharge Date: 10/16/21               ?  ?  ?  ?  ?  ?  ?  ?  ?  ?  ? ?Prior Living Arrangements/Services ?  ?  ?  ?       ?  ?  ?  ?  ? ?Activities of Daily Living ?  ?  ? ?Permission Sought/Granted ?  ?  ?   ?   ?   ?   ? ?Emotional Assessment ?  ?  ?  ?  ?  ?  ? ?Admission diagnosis:  Primary osteoarthritis of right knee  M17.11 ?There are no problems to display for this patient. ? ?PCP:  Marguarite Arbour, MD ?Pharmacy:   ?[id number] [place], [place] - [address] AT [facility] [address] ?[address] ?[place] [address] ?Phone: [phone] Fax: [phone] ? ?[id number] [place], [place] - [address] ?[address] ?[place] [address] ?Phone: [phone] Fax: [phone] ? ? ? ? ?Social Determinants of Health (SDOH) Interventions ?  ? ?Readmission Risk Interventions ?No flowsheet data found. ? ? ?

## 2021-10-16 NOTE — Progress Notes (Addendum)
Bladder scanned for 626 mls. Does feel urge to void. Attempted with only 100 mls urine. States he does feel some relief. ?

## 2021-10-16 NOTE — Anesthesia Postprocedure Evaluation (Signed)
Anesthesia Post Note ? ?Patient: Mark Davenport ? ?Procedure(s) Performed: Right partial knee replacement (Right: Knee) ? ?Patient location during evaluation: PACU ?Anesthesia Type: Combined General/Spinal ?Level of consciousness: awake and alert ?Pain management: pain level controlled ?Vital Signs Assessment: post-procedure vital signs reviewed and stable ?Respiratory status: spontaneous breathing, nonlabored ventilation and respiratory function stable ?Cardiovascular status: blood pressure returned to baseline and stable ?Postop Assessment: no apparent nausea or vomiting ?Anesthetic complications: no ? ? ?No notable events documented. ? ? ?Last Vitals:  ?Vitals:  ? 10/16/21 1430 10/16/21 1445  ?BP: 131/82 137/88  ?Pulse: 69 69  ?Resp: 14 14  ?Temp: (!) 36.1 ?C   ?SpO2: 95% 96%  ?  ?Last Pain:  ?Vitals:  ? 10/16/21 1430  ?TempSrc:   ?PainSc: 0-No pain  ? ? ?  ?  ?  ?  ?  ?  ? ?Foye Deer ? ? ? ? ?

## 2021-10-17 ENCOUNTER — Encounter: Payer: Self-pay | Admitting: Surgery

## 2023-02-03 ENCOUNTER — Ambulatory Visit: Payer: Medicare HMO

## 2023-02-03 DIAGNOSIS — Z1211 Encounter for screening for malignant neoplasm of colon: Secondary | ICD-10-CM

## 2023-02-03 DIAGNOSIS — K640 First degree hemorrhoids: Secondary | ICD-10-CM

## 2023-02-03 DIAGNOSIS — K2950 Unspecified chronic gastritis without bleeding: Secondary | ICD-10-CM

## 2023-02-03 DIAGNOSIS — K2289 Other specified disease of esophagus: Secondary | ICD-10-CM

## 2023-02-03 DIAGNOSIS — K5730 Diverticulosis of large intestine without perforation or abscess without bleeding: Secondary | ICD-10-CM
